# Patient Record
Sex: Male | Born: 1950 | Race: Black or African American | Hispanic: No | Marital: Single | Smoking: Former smoker
Health system: Southern US, Community
[De-identification: ages and names within clinical notes are randomized; demographics above are authoritative.]

## PROBLEM LIST (undated history)

## (undated) DIAGNOSIS — E785 Hyperlipidemia, unspecified: Secondary | ICD-10-CM

## (undated) DIAGNOSIS — I872 Venous insufficiency (chronic) (peripheral): Secondary | ICD-10-CM

## (undated) DIAGNOSIS — I5189 Other ill-defined heart diseases: Secondary | ICD-10-CM

## (undated) DIAGNOSIS — G629 Polyneuropathy, unspecified: Secondary | ICD-10-CM

## (undated) DIAGNOSIS — E0500 Thyrotoxicosis with diffuse goiter without thyrotoxic crisis or storm: Secondary | ICD-10-CM

## (undated) DIAGNOSIS — I2510 Atherosclerotic heart disease of native coronary artery without angina pectoris: Secondary | ICD-10-CM

## (undated) DIAGNOSIS — I10 Essential (primary) hypertension: Secondary | ICD-10-CM

## (undated) DIAGNOSIS — E039 Hypothyroidism, unspecified: Secondary | ICD-10-CM

---

## 2015-01-20 ENCOUNTER — Encounter (HOSPITAL_COMMUNITY): Payer: Self-pay | Admitting: Pulmonary Disease

## 2015-01-20 ENCOUNTER — Inpatient Hospital Stay (HOSPITAL_COMMUNITY): Payer: Non-veteran care

## 2015-01-20 ENCOUNTER — Inpatient Hospital Stay (HOSPITAL_COMMUNITY)
Admission: EM | Admit: 2015-01-20 | Discharge: 2015-01-29 | DRG: 637 | Disposition: A | Discharge status: Home Health | Payer: Non-veteran care | Attending: Family Medicine | Admitting: Family Medicine

## 2015-01-20 DIAGNOSIS — J9690 Respiratory failure, unspecified, unspecified whether with hypoxia or hypercapnia: Secondary | ICD-10-CM

## 2015-01-20 DIAGNOSIS — E870 Hyperosmolality and hypernatremia: Secondary | ICD-10-CM | POA: Diagnosis present

## 2015-01-20 DIAGNOSIS — E119 Type 2 diabetes mellitus without complications: Secondary | ICD-10-CM

## 2015-01-20 DIAGNOSIS — E039 Hypothyroidism, unspecified: Secondary | ICD-10-CM | POA: Diagnosis present

## 2015-01-20 DIAGNOSIS — I129 Hypertensive chronic kidney disease with stage 1 through stage 4 chronic kidney disease, or unspecified chronic kidney disease: Secondary | ICD-10-CM | POA: Diagnosis present

## 2015-01-20 DIAGNOSIS — E876 Hypokalemia: Secondary | ICD-10-CM | POA: Diagnosis not present

## 2015-01-20 DIAGNOSIS — N179 Acute kidney failure, unspecified: Secondary | ICD-10-CM | POA: Diagnosis present

## 2015-01-20 DIAGNOSIS — R32 Unspecified urinary incontinence: Secondary | ICD-10-CM | POA: Diagnosis present

## 2015-01-20 DIAGNOSIS — I639 Cerebral infarction, unspecified: Secondary | ICD-10-CM

## 2015-01-20 DIAGNOSIS — N189 Chronic kidney disease, unspecified: Secondary | ICD-10-CM | POA: Diagnosis present

## 2015-01-20 DIAGNOSIS — I2510 Atherosclerotic heart disease of native coronary artery without angina pectoris: Secondary | ICD-10-CM

## 2015-01-20 DIAGNOSIS — K859 Acute pancreatitis, unspecified: Secondary | ICD-10-CM | POA: Diagnosis present

## 2015-01-20 DIAGNOSIS — E875 Hyperkalemia: Secondary | ICD-10-CM | POA: Diagnosis present

## 2015-01-20 DIAGNOSIS — G9341 Metabolic encephalopathy: Secondary | ICD-10-CM | POA: Diagnosis present

## 2015-01-20 DIAGNOSIS — E1110 Type 2 diabetes mellitus with ketoacidosis without coma: Secondary | ICD-10-CM | POA: Diagnosis present

## 2015-01-20 DIAGNOSIS — E1310 Other specified diabetes mellitus with ketoacidosis without coma: Principal | ICD-10-CM | POA: Diagnosis present

## 2015-01-20 DIAGNOSIS — I214 Non-ST elevation (NSTEMI) myocardial infarction: Secondary | ICD-10-CM | POA: Diagnosis present

## 2015-01-20 DIAGNOSIS — I252 Old myocardial infarction: Secondary | ICD-10-CM | POA: Diagnosis not present

## 2015-01-20 DIAGNOSIS — K5900 Constipation, unspecified: Secondary | ICD-10-CM | POA: Diagnosis not present

## 2015-01-20 DIAGNOSIS — E1011 Type 1 diabetes mellitus with ketoacidosis with coma: Secondary | ICD-10-CM | POA: Insufficient documentation

## 2015-01-20 DIAGNOSIS — D696 Thrombocytopenia, unspecified: Secondary | ICD-10-CM | POA: Diagnosis present

## 2015-01-20 DIAGNOSIS — R531 Weakness: Secondary | ICD-10-CM

## 2015-01-20 DIAGNOSIS — R4182 Altered mental status, unspecified: Secondary | ICD-10-CM | POA: Diagnosis present

## 2015-01-20 DIAGNOSIS — E0810 Diabetes mellitus due to underlying condition with ketoacidosis without coma: Secondary | ICD-10-CM | POA: Diagnosis not present

## 2015-01-20 DIAGNOSIS — I8290 Acute embolism and thrombosis of unspecified vein: Secondary | ICD-10-CM | POA: Insufficient documentation

## 2015-01-20 HISTORY — DX: Essential (primary) hypertension: I10

## 2015-01-20 HISTORY — DX: Thyrotoxicosis with diffuse goiter without thyrotoxic crisis or storm: E05.00

## 2015-01-20 HISTORY — DX: Hypothyroidism, unspecified: E03.9

## 2015-01-20 LAB — COMPREHENSIVE METABOLIC PANEL
ALBUMIN: 3.2 g/dL — AB (ref 3.5–5.2)
ALT: 29 U/L (ref 0–53)
AST: 47 U/L — AB (ref 0–37)
Alkaline Phosphatase: 88 U/L (ref 39–117)
Anion gap: 28 — ABNORMAL HIGH (ref 5–15)
BUN: 92 mg/dL — AB (ref 6–23)
CO2: 12 mmol/L — ABNORMAL LOW (ref 19–32)
CREATININE: 6.12 mg/dL — AB (ref 0.50–1.35)
Calcium: 8.6 mg/dL (ref 8.4–10.5)
Chloride: 101 mmol/L (ref 96–112)
GFR calc Af Amer: 10 mL/min — ABNORMAL LOW (ref >90)
GFR calc non Af Amer: 9 mL/min — ABNORMAL LOW (ref >90)
GLUCOSE: 1399 mg/dL — AB (ref 70–99)
Potassium: 6.4 mmol/L (ref 3.5–5.1)
SODIUM: 141 mmol/L (ref 135–145)
Total Bilirubin: 2.4 mg/dL — ABNORMAL HIGH (ref 0.3–1.2)
Total Protein: 6.4 g/dL (ref 6.0–8.3)

## 2015-01-20 LAB — I-STAT VENOUS BLOOD GAS, ED
ACID-BASE DEFICIT: 18 mmol/L — AB (ref 0.0–2.0)
Bicarbonate: 10.5 mEq/L — ABNORMAL LOW (ref 20.0–24.0)
O2 SAT: 55 %
PCO2 VEN: 33.9 mmHg — AB (ref 45.0–50.0)
TCO2: 12 mmol/L (ref 0–100)
pH, Ven: 7.1 — CL (ref 7.250–7.300)
pO2, Ven: 39 mmHg (ref 30.0–45.0)

## 2015-01-20 LAB — URINALYSIS, ROUTINE W REFLEX MICROSCOPIC
Glucose, UA: >1000 mg/dL — AB
Ketones, ur: 15 mg/dL — AB
Ketones, ur: 15 mg/dL — AB
LEUKOCYTES UA: NEGATIVE
LEUKOCYTES UA: NEGATIVE
NITRITE: NEGATIVE
NITRITE: NEGATIVE
PH: 5 (ref 5.0–8.0)
PROTEIN: NEGATIVE mg/dL
Protein, ur: NEGATIVE mg/dL
SPECIFIC GRAVITY, URINE: 1.027 (ref 1.005–1.030)
SPECIFIC GRAVITY, URINE: 1.028 (ref 1.005–1.030)
UROBILINOGEN UA: 0.2 mg/dL (ref 0.0–1.0)
Urobilinogen, UA: 0.2 mg/dL (ref 0.0–1.0)
pH: 5 (ref 5.0–8.0)

## 2015-01-20 LAB — BASIC METABOLIC PANEL
Anion gap: 25 — ABNORMAL HIGH (ref 5–15)
BUN: 94 mg/dL — ABNORMAL HIGH (ref 6–23)
CO2: 13 mmol/L — ABNORMAL LOW (ref 19–32)
Calcium: 8.9 mg/dL (ref 8.4–10.5)
Chloride: 109 mmol/L (ref 96–112)
Creatinine, Ser: 5.53 mg/dL — ABNORMAL HIGH (ref 0.50–1.35)
GFR calc Af Amer: 11 mL/min — ABNORMAL LOW (ref >90)
GFR calc non Af Amer: 10 mL/min — ABNORMAL LOW (ref >90)
GLUCOSE: 1134 mg/dL — AB (ref 70–99)
Potassium: 5.5 mmol/L — ABNORMAL HIGH (ref 3.5–5.1)
SODIUM: 147 mmol/L — AB (ref 135–145)

## 2015-01-20 LAB — CBC WITH DIFFERENTIAL/PLATELET
BASOS ABS: 0 10*3/uL (ref 0.0–0.1)
Basophils Relative: 0 % (ref 0–1)
EOS PCT: 0 % (ref 0–5)
Eosinophils Absolute: 0 10*3/uL (ref 0.0–0.7)
HCT: 52.7 % — ABNORMAL HIGH (ref 39.0–52.0)
Hemoglobin: 16.1 g/dL (ref 13.0–17.0)
LYMPHS ABS: 1.4 10*3/uL (ref 0.7–4.0)
LYMPHS PCT: 12 % (ref 12–46)
MCH: 33.3 pg (ref 26.0–34.0)
MCHC: 30.6 g/dL (ref 30.0–36.0)
MCV: 109.1 fL — AB (ref 78.0–100.0)
MONO ABS: 1.1 10*3/uL — AB (ref 0.1–1.0)
Monocytes Relative: 9 % (ref 3–12)
NEUTROS ABS: 9.4 10*3/uL — AB (ref 1.7–7.7)
Neutrophils Relative %: 79 % — ABNORMAL HIGH (ref 43–77)
PLATELETS: 248 10*3/uL (ref 150–400)
RBC: 4.83 MIL/uL (ref 4.22–5.81)
RDW: 15.1 % (ref 11.5–15.5)
WBC: 11.9 10*3/uL — ABNORMAL HIGH (ref 4.0–10.5)

## 2015-01-20 LAB — TROPONIN I
TROPONIN I: 12.15 ng/mL — AB (ref <0.031)
Troponin I: 3.94 ng/mL (ref <0.031)

## 2015-01-20 LAB — URINE MICROSCOPIC-ADD ON

## 2015-01-20 LAB — PHOSPHORUS: Phosphorus: 4.9 mg/dL — ABNORMAL HIGH (ref 2.3–4.6)

## 2015-01-20 LAB — MAGNESIUM: Magnesium: 4.5 mg/dL — ABNORMAL HIGH (ref 1.5–2.5)

## 2015-01-20 LAB — RAPID URINE DRUG SCREEN, HOSP PERFORMED
Amphetamines: NOT DETECTED
Barbiturates: NOT DETECTED
Benzodiazepines: NOT DETECTED
COCAINE: NOT DETECTED
Opiates: NOT DETECTED
TETRAHYDROCANNABINOL: NOT DETECTED

## 2015-01-20 LAB — CBG MONITORING, ED
Glucose-Capillary: >600 mg/dL (ref 70–99)
Glucose-Capillary: >600 mg/dL (ref 70–99)

## 2015-01-20 LAB — I-STAT CHEM 8, ED
BUN: 116 mg/dL — ABNORMAL HIGH (ref 6–23)
CALCIUM ION: 1.08 mmol/L — AB (ref 1.13–1.30)
Chloride: 106 mmol/L (ref 96–112)
Creatinine, Ser: 5 mg/dL — ABNORMAL HIGH (ref 0.50–1.35)
HEMATOCRIT: 54 % — AB (ref 39.0–52.0)
Hemoglobin: 18.4 g/dL — ABNORMAL HIGH (ref 13.0–17.0)
Potassium: 7.9 mmol/L (ref 3.5–5.1)
Sodium: 136 mmol/L (ref 135–145)
TCO2: 8 mmol/L (ref 0–100)

## 2015-01-20 LAB — GLUCOSE, CAPILLARY: Glucose-Capillary: >600 mg/dL (ref 70–99)

## 2015-01-20 LAB — I-STAT TROPONIN, ED: Troponin i, poc: 5.24 ng/mL (ref 0.00–0.08)

## 2015-01-20 LAB — I-STAT CG4 LACTIC ACID, ED: LACTIC ACID, VENOUS: 5 mmol/L — AB (ref 0.5–2.0)

## 2015-01-20 LAB — AMYLASE: Amylase: 1500 U/L — ABNORMAL HIGH (ref 0–105)

## 2015-01-20 LAB — LACTIC ACID, PLASMA: Lactic Acid, Venous: 2.8 mmol/L (ref 0.5–2.0)

## 2015-01-20 LAB — LIPASE, BLOOD: Lipase: 2490 U/L — ABNORMAL HIGH (ref 11–59)

## 2015-01-20 LAB — MRSA PCR SCREENING: MRSA BY PCR: NEGATIVE

## 2015-01-20 LAB — TSH: TSH: 2.144 u[IU]/mL (ref 0.350–4.500)

## 2015-01-20 LAB — SODIUM, URINE, RANDOM

## 2015-01-20 LAB — CREATININE, URINE, RANDOM: CREATININE, URINE: 76.66 mg/dL

## 2015-01-20 LAB — CK: Total CK: 349 U/L — ABNORMAL HIGH (ref 7–232)

## 2015-01-20 MED ORDER — DEXTROSE-NACL 5-0.45 % IV SOLN
INTRAVENOUS | Status: DC
  Administered 2015-01-21 (×2): via INTRAVENOUS

## 2015-01-20 MED ORDER — PANTOPRAZOLE SODIUM 40 MG IV SOLR
40.0000 mg | INTRAVENOUS | Status: DC
  Administered 2015-01-20 – 2015-01-22 (×3): 40 mg via INTRAVENOUS
  Filled 2015-01-20 (×4): qty 40

## 2015-01-20 MED ORDER — SODIUM CHLORIDE 0.9 % IV BOLUS (SEPSIS)
1000.0000 mL | Freq: Once | INTRAVENOUS | Status: AC
  Administered 2015-01-20: 1000 mL via INTRAVENOUS

## 2015-01-20 MED ORDER — SODIUM CHLORIDE 0.9 % IV SOLN
1000.0000 mL | INTRAVENOUS | Status: DC
  Administered 2015-01-20: 1000 mL via INTRAVENOUS

## 2015-01-20 MED ORDER — DEXTROSE-NACL 5-0.45 % IV SOLN: INTRAVENOUS | Status: DC

## 2015-01-20 MED ORDER — SODIUM CHLORIDE 0.9 % IV SOLN
INTRAVENOUS | Status: DC
  Administered 2015-01-20: 5.4 [IU]/h via INTRAVENOUS
  Filled 2015-01-20: qty 2.5

## 2015-01-20 MED ORDER — ONDANSETRON HCL 4 MG/2ML IJ SOLN
4.0000 mg | Freq: Four times a day (QID) | INTRAMUSCULAR | Status: DC | PRN
Start: 2015-01-20 — End: 2015-01-29

## 2015-01-20 MED ORDER — ASPIRIN 300 MG RE SUPP
300.0000 mg | Freq: Once | RECTAL | Status: AC
  Administered 2015-01-20: 300 mg via RECTAL
  Filled 2015-01-20: qty 1

## 2015-01-20 MED ORDER — SODIUM CHLORIDE 0.9 % IV SOLN
1000.0000 mL | Freq: Once | INTRAVENOUS | Status: AC
  Administered 2015-01-20: 1000 mL via INTRAVENOUS

## 2015-01-20 MED ORDER — HEPARIN SODIUM (PORCINE) 5000 UNIT/ML IJ SOLN
5000.0000 [IU] | Freq: Three times a day (TID) | INTRAMUSCULAR | Status: DC
  Administered 2015-01-20 – 2015-01-21 (×3): 5000 [IU] via SUBCUTANEOUS
  Filled 2015-01-20 (×6): qty 1

## 2015-01-20 MED ORDER — CETYLPYRIDINIUM CHLORIDE 0.05 % MT LIQD
7.0000 mL | Freq: Two times a day (BID) | OROMUCOSAL | Status: DC
  Administered 2015-01-20 – 2015-01-21 (×3): 7 mL via OROMUCOSAL

## 2015-01-20 MED ORDER — SODIUM CHLORIDE 0.9 % IV SOLN
INTRAVENOUS | Status: DC
  Administered 2015-01-20: 20:00:00 via INTRAVENOUS

## 2015-01-20 MED ORDER — INSULIN REGULAR HUMAN 100 UNIT/ML IJ SOLN
INTRAMUSCULAR | Status: DC
  Administered 2015-01-21: 7.9 [IU]/h via INTRAVENOUS
  Administered 2015-01-21: 10.1 [IU]/h via INTRAVENOUS
  Filled 2015-01-20 (×3): qty 2.5

## 2015-01-20 MED ORDER — SODIUM CHLORIDE 0.9 % IV SOLN: INTRAVENOUS | Status: AC

## 2015-01-20 NOTE — Progress Notes (Signed)
eLink Physician-Brief Progress Note Patient Name: Dave LevinsWilliam Cervantes DOB: 03/09/1951 MRN: [id number]   Date of Service  01/20/2015  HPI/Events of Note  Lactic acid still high but better  eICU Interventions  Give more volume     Intervention Category Major Interventions: Hypotension - evaluation and management;Shock - evaluation and management  Shan Levansatrick Jerol Rufener 01/20/2015, 6:18 PM

## 2015-01-20 NOTE — ED Notes (Signed)
  CBG HI 

## 2015-01-20 NOTE — ED Notes (Signed)
To ED via [facility] from home with c/o of altered mental status-- family found pt slumped on floor in front of his bedroom door, pt had multiple juice bottles full of urine in room, as well as garbage around the room- pt was in puddle of urine, unknown downtime. EMS CBG was high, family denies any hx of diabetes. [facility].

## 2015-01-20 NOTE — ED Notes (Signed)
Sister states that she could not get in the pt's bedroom door last night, brother last talked to pt two days ago and pt was normal.

## 2015-01-20 NOTE — ED Notes (Signed)
CBG >600 again, glucostabilizer read to change insulin to 21.6 units/hr

## 2015-01-20 NOTE — Progress Notes (Signed)
Lactic acid level 2.8 called to Dr. Jonni SangerP. Wright. Trop level of 3.94 Dr. Jonni SangerP Wright notified.

## 2015-01-20 NOTE — ED Provider Notes (Signed)
CSN: [id number]     Arrival date & time 01/20/15  1207 History   First MD Initiated Contact with Patient 01/20/15 1221     Chief Complaint  Patient presents with  . Altered Mental Status     (Consider location/radiation/quality/duration/timing/severity/associated sxs/prior Treatment) HPI Comments: LEVEL 5 CAVEAT FOR ALTERED MENTAL STATUS.  Pt comes in with cc of confusion. Per EMS, family noted pt to be confused, with lots of juice bottles filled with urine. Pt has no hx of DM. Unknown last normal. Pt is confused and altered. GCS - e/v/m  = 2/2/4 = 8 at arrival.   Patient is a 64 y.o. male presenting with altered mental status. The history is provided by the patient.  Altered Mental Status   Past Medical History  Diagnosis Date  . Hypertension   . Hypothyroidism    No past surgical history on file. No family history on file. History  Substance Use Topics  . Smoking status: Not on file  . Smokeless tobacco: Not on file  . Alcohol Use: Not on file    Review of Systems  Unable to perform ROS: Mental status change      Allergies  Review of patient's allergies indicates no known allergies.  Home Medications   Prior to Admission medications   Not on File   BP 107/69 mmHg  Pulse 86  Temp(Src) 94.8 F (34.9 C) (Core (Comment))  Resp 23  SpO2 99% Physical Exam  Constitutional: He appears well-developed.  HENT:  Head: Atraumatic.  Dry mucosa  Neck: Neck supple.  Cardiovascular: Normal rate.   Pulmonary/Chest:  tachypnea  Abdominal: Soft. There is no tenderness.  Lymphadenopathy:    He has no cervical adenopathy.  Neurological:  gcs - 8  Skin: Skin is warm.  Nursing note and vitals reviewed.   ED Course  Procedures (including critical care time) Labs Review Labs Reviewed  CBC WITH DIFFERENTIAL/PLATELET - Abnormal; Notable for the following:    WBC 11.9 (*)    HCT 52.7 (*)    MCV 109.1 (*)    Neutrophils Relative % 79 (*)    Neutro Abs 9.4 (*)    Monocytes Absolute 1.1 (*)    All other components within normal limits  URINALYSIS, ROUTINE W REFLEX MICROSCOPIC - Abnormal; Notable for the following:    Glucose, UA >1000 (*)    Hgb urine dipstick SMALL (*)    Bilirubin Urine SMALL (*)    Ketones, ur 15 (*)    All other components within normal limits  CK - Abnormal; Notable for the following:    Total CK 349 (*)    All other components within normal limits  URINE MICROSCOPIC-ADD ON - Abnormal; Notable for the following:    Bacteria, UA FEW (*)    Casts HYALINE CASTS (*)    All other components within normal limits  COMPREHENSIVE METABOLIC PANEL - Abnormal; Notable for the following:    Potassium 6.4 (*)    CO2 12 (*)    Glucose, Bld 1399 (*)    BUN 92 (*)    Creatinine, Ser 6.12 (*)    Albumin 3.2 (*)    AST 47 (*)    Total Bilirubin 2.4 (*)    GFR calc non Af Amer 9 (*)    GFR calc Af Amer 10 (*)    Anion gap 28 (*)    All other components within normal limits  CBG MONITORING, ED - Abnormal; Notable for the following:    Glucose-Capillary >  600 (*)    All other components within normal limits  CBG MONITORING, ED - Abnormal; Notable for the following:    Glucose-Capillary >600 (*)    All other components within normal limits  I-STAT VENOUS BLOOD GAS, ED - Abnormal; Notable for the following:    pH, Ven 7.100 (*)    pCO2, Ven 33.9 (*)    Bicarbonate 10.5 (*)    Acid-base deficit 18.0 (*)    All other components within normal limits  I-STAT CHEM 8, ED - Abnormal; Notable for the following:    Potassium 7.9 (*)    BUN 116 (*)    Creatinine, Ser 5.00 (*)    Glucose, Bld >700 (*)    Calcium, Ion 1.08 (*)    Hemoglobin 18.4 (*)    HCT 54.0 (*)    All other components within normal limits  I-STAT CG4 LACTIC ACID, ED - Abnormal; Notable for the following:    Lactic Acid, Venous 5.00 (*)    All other components within normal limits  I-STAT TROPOININ, ED - Abnormal; Notable for the following:    Troponin i, poc 5.24 (*)     All other components within normal limits  CBG MONITORING, ED - Abnormal; Notable for the following:    Glucose-Capillary >600 (*)    All other components within normal limits  CBG MONITORING, ED - Abnormal; Notable for the following:    Glucose-Capillary >600 (*)    All other components within normal limits  URINE CULTURE  CULTURE, BLOOD (ROUTINE X 2)  CULTURE, BLOOD (ROUTINE X 2)  CULTURE, BLOOD (ROUTINE X 2)  CULTURE, BLOOD (ROUTINE X 2)  URINE CULTURE  TROPONIN I  BASIC METABOLIC PANEL  BASIC METABOLIC PANEL  BASIC METABOLIC PANEL  BASIC METABOLIC PANEL  CBC  MAGNESIUM  PHOSPHORUS  CBC WITH DIFFERENTIAL/PLATELET  TROPONIN I  TROPONIN I  URINALYSIS, ROUTINE W REFLEX MICROSCOPIC  URINE RAPID DRUG SCREEN (HOSP PERFORMED)  HEMOGLOBIN A1C  LIPASE, BLOOD  AMYLASE  LACTIC ACID, PLASMA  TSH  SODIUM, URINE, RANDOM  CREATININE, URINE, RANDOM    Imaging Review Dg Chest Port 1 View  01/20/2015   CLINICAL DATA:  Respiratory failure and altered mental status.  EXAM: PORTABLE CHEST - 1 VIEW  COMPARISON:  None.  FINDINGS: The mediastinal contour is normal. The heart size is upper limits of normal. The aorta is tortuous. The lung volumes are low. There is mild elevation of right hemidiaphragm. There is no focal infiltrate, pulmonary edema, or pleural effusion. The visualized skeletal structures are unremarkable.  IMPRESSION: No active cardiopulmonary disease.   Electronically Signed   By: Sherian Rein M.D.   On: 01/20/2015 16:30     EKG Interpretation   Date/Time:  Monday January 20 2015 12:08:42 EST Ventricular Rate:  83 PR Interval:  171 QRS Duration: 104 QT Interval:  421 QTC Calculation: 495 R Axis:   55 Text Interpretation:  Sinus rhythm Consider left atrial enlargement  Inferior infarct, age indeterminate Lateral leads are also involved Q  WAVES INFERIOR LEADS without SY changes No old tracing to compare  Confirmed by Rhunette Croft, MD, Janey Genta [phone]) on 01/20/2015 2:16:46  PM      MDM   Final diagnoses:  Type 1 diabetes mellitus with ketoacidosis and coma  NSTEMI (non-ST elevated myocardial infarction)    CRITICAL CARE Performed by: Derwood Kaplan   Total critical care time: 50 min  Critical care time was exclusive of separately billable procedures and treating other patients.  Critical  care was necessary to treat or prevent imminent or life-threatening deterioration.  Critical care was time spent personally by me on the following activities: development of treatment plan with patient and/or surrogate as well as nursing, discussions with consultants, evaluation of patient's response to treatment, examination of patient, obtaining history from patient or surrogate, ordering and performing treatments and interventions, ordering and review of laboratory studies, ordering and review of radiographic studies, pulse oximetry and re-evaluation of patient's condition.   Pt comes in with cc of AMS. He is note to be hyperglycemic. Pt unable to get any history, no records in our system, family not at bedside.  Pt is dry, and is in DKA. Elevated lactate seen, with elevated troponin. Rectal Aspirin to be given.  Pt is comatose from DKA - so we will call ICU team. Pt is protecting airway. DKA protocol initiated - ivf, insulin. Labs reviewed.   Derwood Kaplan, MD 01/20/15 [phone]

## 2015-01-20 NOTE — Procedures (Signed)
Arterial Catheter Insertion Procedure Note Corwin LevinsWilliam Elks [id number] [birth year]  Procedure: Insertion of Arterial Catheter  Indications: Blood pressure monitoring and Frequent blood sampling  Procedure Details Consent: Unable to obtain consent because of altered level of consciousness. Time Out: Verified patient identification, verified procedure, site/side was marked, verified correct patient position, special equipment/implants available, medications/allergies/relevent history reviewed, required imaging and test results available.  Performed  Maximum sterile technique was used including antiseptics, cap, gloves, gown, hand hygiene, mask and sheet. Skin prep: Chlorhexidine; local anesthetic administered 20 gauge catheter was inserted into right radial artery using the Seldinger technique.  Evaluation Blood flow good; BP tracing good. Complications: No apparent complications.   Inez PilgrimCOCKMAN, Kendrew Paci 01/20/2015

## 2015-01-20 NOTE — ED Notes (Addendum)
Dr. Rhunette CroftNanavati was given the patients critical I-Stat Troponin results of 5.24.

## 2015-01-20 NOTE — H&P (Signed)
PULMONARY / CRITICAL CARE MEDICINE   Name: Dave Cervantes MRN: [id number] DOB: Oct 12, 1951    ADMISSION DATE:  01/20/2015 CONSULTATION DATE:  01/20/2015  REFERRING MD :  EDP  CHIEF COMPLAINT:  AMS  INITIAL PRESENTATION: 64 year old male presented to [facility] ED 2/15 with AMS x 2 days. In ED was lethargic and hyperglycemic (Glu 1400) with positive urine ketones. PCCM called for admission.  STUDIES:    SIGNIFICANT EVENTS:   HISTORY OF PRESENT ILLNESS:  64 year old male with PMH significant for HTN and hypothyroidism followed at [place]. He presented to [facility] ED 2/15 following AMS x 2 days. Day of presentation his sister found him slumped in front of bedroom door. He was incontinent of urine. She called EMS and he was brought to [facility] ED where he was found to be severely hyperglycemic with glucose 1400. He was started on insulin gtt and provided volume with 2L NS. He remained somnolent and PCCM was called for admission.   PAST MEDICAL HISTORY :   has no past medical history on file.  has no past surgical history on file. Prior to Admission medications   Not on File   Allergies not on file  FAMILY HISTORY:  has no family status information on file.  SOCIAL HISTORY:    REVIEW OF SYSTEMS:  Limited due to somnolence Bolds are positive  Constitutional: weight loss, gain, night sweats, Fevers, chills, fatigue .  HEENT: headaches, Sore throat, sneezing, nasal congestion, post nasal drip, Difficulty swallowing, Tooth/dental problems, visual complaints visual changes, ear ache CV:  chest pain, radiates: ,Orthopnea, PND, swelling in lower extremities, dizziness, palpitations, syncope.  GI  heartburn, indigestion, abdominal pain, nausea, vomiting, diarrhea, change in bowel habits, loss of appetite, bloody stools.  Resp: cough, productive: , hemoptysis, dyspnea, chest pain, pleuritic.  Skin: rash or itching or icterus GU: dysuria, change in color of urine, urgency or frequency. flank pain, hematuria  MS:  joint pain or swelling. decreased range of motion  Psych: change in mood or affect. depression or anxiety.  Neuro: difficulty with speech, weakness, numbness, ataxia    SUBJECTIVE:   VITAL SIGNS: Temp:  [94.5 F (34.7 C)-95.1 F (35.1 C)] 94.8 F (34.9 C) (02/15 1500) Pulse Rate:  [80-86] 86 (02/15 1500) Resp:  [20-27] 21 (02/15 1500) BP: (101-118)/(63-77) 109/77 mmHg (02/15 1500) SpO2:  [99 %-100 %] 99 % (02/15 1500) HEMODYNAMICS:   VENTILATOR SETTINGS:   INTAKE / OUTPUT:  Intake/Output Summary (Last 24 hours) at 01/20/15 1523 Last data filed at 01/20/15 1300  Gross per 24 hour  Intake      0 ml  Output    300 ml  Net   -300 ml    PHYSICAL EXAMINATION: General:  Male of normal body habitus in NAD Neuro:  Lethargic, follows commands HEENT:  [place]/AT, no JVD Cardiovascular:  RRR, no MRG Lungs:  Clear bilateral breath sounds Abdomen:  Soft, non-tender, non-distended Musculoskeletal:  No acute deformities or ROM limitation Skin:  Grossly intact  LABS:  CBC  Recent Labs Lab 01/20/15 1223 01/20/15 1249  WBC 11.9*  --   HGB 16.1 18.4*  HCT 52.7* 54.0*  PLT 248  --    Coag's No results for input(s): APTT, INR in the last 168 hours. BMET  Recent Labs Lab 01/20/15 1249 01/20/15 1404  NA 136 141  K 7.9* 6.4*  CL 106 101  CO2  --  12*  BUN 116* 92*  CREATININE 5.00* 6.12*  GLUCOSE >700* 1399*  Electrolytes  Recent Labs Lab 01/20/15 1404  CALCIUM 8.6   Sepsis Markers  Recent Labs Lab 01/20/15 1249  LATICACIDVEN 5.00*   ABG No results for input(s): PHART, PCO2ART, PO2ART in the last 168 hours. Liver Enzymes  Recent Labs Lab 01/20/15 1404  AST 47*  ALT 29  ALKPHOS 88  BILITOT 2.4*  ALBUMIN 3.2*   Cardiac Enzymes No results for input(s): TROPONINI, PROBNP in the last 168 hours. Glucose  Recent Labs Lab 01/20/15 1214 01/20/15 1407 01/20/15 1514  GLUCAP >600* >600* >600*    Imaging No results found.   ASSESSMENT /  PLAN:  PULMONARY A: No acute issues  P:   Monitor  CARDIOVASCULAR A:  Elevated troponin H/o HTN  P:  Follow troponin Hold preadmission Amlodipine, HCTZ, lisinopril   RENAL A:   High AG metabolic acidosis - lactic, ketoacidosis Acute renal failure Hyperkalemia  P:   Check FeNa K repletion per DKA protocol Follow Bmet q 4 hours Repeat lactic acid  GASTROINTESTINAL A:  No acute issues  P:   NPO SUP: IV Protonix Amylase, lipase  HEMATOLOGIC A:   No acute issues  P:  Follow CBC  INFECTIOUS A:  Leukocytosis  P:   BCx2 2/15 >>> Urine 2/15 >>> Monitor  ENDOCRINE A:  Diabetic ketoacidosis  P:   Initiate DKA protocol Insulin gtt IVF Holding synthroid Hourly CBG monitoring Check TSH  NEUROLOGIC A:   Acute metabolic encephalatrophy   P:   RASS goal: 0 Monitor UDS   FAMILY  - Updates:   - Inter-disciplinary family meet or Palliative Care meeting due by:  2/22   Joneen RoachPaul Hoffman, AGACNP-BC [place] [facility]/Critical [facility] Pager [phone] or [phone]   Reviewed above, examined.  64 yo male was brought to ER after being found by family lethargic on floor at home.  He reportedly felt ill and might have been getting a cold.  [facility].  There is no hx of DM.  In ER he was found to have renal failure, hyperkalemia, acidosis, and hyperglycemia.  He was started on insulin gtt and IV fluids.  He did not have ECG hyperkalemia.  He is lethargic, but wakes up with stimulation.  His pupils are reactive, he moves all extremities, and is able to speak in short sentences.  He has dry oral mucosa with poor dentition.  Breath sounds are decreased, but no wheeze.  Heart rate is regular, abdomen soft, no skin lesions.  Will admit to ICU, continue insulin gtt/IV fluids, check FeNA, f/u electrolytes, f/u TSH, f/u Lactic acid.  Defer Abx for now.  Defer renal consult for now.  [place] to get [place]  [facility] [place] and [place].  CC time by me independent of APP time is 40 minutes.  Updated pt's family at bedside.  Coralyn HellingVineet Finis Hendricksen, MD [facility] 01/20/2015, 3:51 PM Pager:  [phone] After 3pm call: [phone]

## 2015-01-20 NOTE — ED Notes (Signed)
Dr. Rhunette CroftNanavati was given the patients critical I-Stat Chem 8, I-Stat CG4 Lactic Acid of 5.00 and I-Stat Venous Blood Gas results.

## 2015-01-20 NOTE — Progress Notes (Signed)
Pts. CBG still reading greater than 600. Glucose drip currently maxed out at 50 units per hour. Glucose drip will continue at 50 units per hour per Dr. Lynelle DoctorWright's order until exact glucose obtained on next BMET. Pt. Will continue to be closely monitored.

## 2015-01-20 NOTE — ED Notes (Signed)
Notified that cmp and troponin hemolyzed. Tubes redrawn and orders placed.

## 2015-01-21 ENCOUNTER — Inpatient Hospital Stay (HOSPITAL_COMMUNITY): Payer: Non-veteran care

## 2015-01-21 DIAGNOSIS — J9690 Respiratory failure, unspecified, unspecified whether with hypoxia or hypercapnia: Secondary | ICD-10-CM

## 2015-01-21 LAB — BASIC METABOLIC PANEL
ANION GAP: 11 (ref 5–15)
Anion gap: 26 — ABNORMAL HIGH (ref 5–15)
Anion gap: 12 (ref 5–15)
Anion gap: 12 (ref 5–15)
BUN: 97 mg/dL — ABNORMAL HIGH (ref 6–23)
BUN: 93 mg/dL — ABNORMAL HIGH (ref 6–23)
BUN: 91 mg/dL — ABNORMAL HIGH (ref 6–23)
BUN: 88 mg/dL — ABNORMAL HIGH (ref 6–23)
BUN: 83 mg/dL — ABNORMAL HIGH (ref 6–23)
BUN: 78 mg/dL — ABNORMAL HIGH (ref 6–23)
CHLORIDE: 118 mmol/L — AB (ref 96–112)
CHLORIDE: 125 mmol/L — AB (ref 96–112)
CHLORIDE: 129 mmol/L — AB (ref 96–112)
CO2: 10 mmol/L — CL (ref 19–32)
CO2: 20 mmol/L (ref 19–32)
CO2: 20 mmol/L (ref 19–32)
CO2: 20 mmol/L (ref 19–32)
CO2: 22 mmol/L (ref 19–32)
CO2: 17 mmol/L — ABNORMAL LOW (ref 19–32)
CREATININE: 5.49 mg/dL — AB (ref 0.50–1.35)
CREATININE: 3.81 mg/dL — AB (ref 0.50–1.35)
Calcium: 8.9 mg/dL (ref 8.4–10.5)
Calcium: 8.6 mg/dL (ref 8.4–10.5)
Calcium: 8.9 mg/dL (ref 8.4–10.5)
Calcium: 8.9 mg/dL (ref 8.4–10.5)
Calcium: 8.7 mg/dL (ref 8.4–10.5)
Calcium: 8.1 mg/dL — ABNORMAL LOW (ref 8.4–10.5)
Chloride: 129 mmol/L — ABNORMAL HIGH (ref 96–112)
Creatinine, Ser: 5.14 mg/dL — ABNORMAL HIGH (ref 0.50–1.35)
Creatinine, Ser: 4.32 mg/dL — ABNORMAL HIGH (ref 0.50–1.35)
Creatinine, Ser: 4.12 mg/dL — ABNORMAL HIGH (ref 0.50–1.35)
Creatinine, Ser: 3.57 mg/dL — ABNORMAL HIGH (ref 0.50–1.35)
GFR calc Af Amer: 12 mL/min — ABNORMAL LOW (ref >90)
GFR calc Af Amer: 13 mL/min — ABNORMAL LOW (ref >90)
GFR calc Af Amer: 15 mL/min — ABNORMAL LOW (ref >90)
GFR calc Af Amer: 16 mL/min — ABNORMAL LOW (ref >90)
GFR calc non Af Amer: 11 mL/min — ABNORMAL LOW (ref >90)
GFR calc non Af Amer: 13 mL/min — ABNORMAL LOW (ref >90)
GFR calc non Af Amer: 14 mL/min — ABNORMAL LOW (ref >90)
GFR calc non Af Amer: 15 mL/min — ABNORMAL LOW (ref >90)
GFR calc non Af Amer: 17 mL/min — ABNORMAL LOW (ref >90)
GFR, EST AFRICAN AMERICAN: 18 mL/min — AB (ref >90)
GFR, EST AFRICAN AMERICAN: 19 mL/min — AB (ref >90)
GFR, EST NON AFRICAN AMERICAN: 10 mL/min — AB (ref >90)
GLUCOSE: 216 mg/dL — AB (ref 70–99)
GLUCOSE: 442 mg/dL — AB (ref 70–99)
Glucose, Bld: 825 mg/dL (ref 70–99)
Glucose, Bld: 519 mg/dL — ABNORMAL HIGH (ref 70–99)
Glucose, Bld: 144 mg/dL — ABNORMAL HIGH (ref 70–99)
Glucose, Bld: 151 mg/dL — ABNORMAL HIGH (ref 70–99)
POTASSIUM: 3.2 mmol/L — AB (ref 3.5–5.1)
POTASSIUM: 3.7 mmol/L (ref 3.5–5.1)
Potassium: 3.7 mmol/L (ref 3.5–5.1)
Potassium: 3.1 mmol/L — ABNORMAL LOW (ref 3.5–5.1)
Potassium: 3.4 mmol/L — ABNORMAL LOW (ref 3.5–5.1)
Potassium: 3.5 mmol/L (ref 3.5–5.1)
SODIUM: 154 mmol/L — AB (ref 135–145)
SODIUM: 157 mmol/L — AB (ref 135–145)
SODIUM: 161 mmol/L — AB (ref 135–145)
Sodium: 160 mmol/L — ABNORMAL HIGH (ref 135–145)
Sodium: 163 mmol/L (ref 135–145)
Sodium: 158 mmol/L — ABNORMAL HIGH (ref 135–145)

## 2015-01-21 LAB — POCT I-STAT, CHEM 8
BUN: 116 mg/dL — AB (ref 6–23)
CHLORIDE: 106 mmol/L (ref 96–112)
CREATININE: 5 mg/dL — AB (ref 0.50–1.35)
Calcium, Ion: 1.08 mmol/L — ABNORMAL LOW (ref 1.13–1.30)
Glucose, Bld: >700 mg/dL (ref 70–99)
HCT: 54 % — ABNORMAL HIGH (ref 39.0–52.0)
Hemoglobin: 18.4 g/dL — ABNORMAL HIGH (ref 13.0–17.0)
POTASSIUM: 7.9 mmol/L — AB (ref 3.5–5.1)
Sodium: 136 mmol/L (ref 135–145)
TCO2: 8 mmol/L (ref 0–100)

## 2015-01-21 LAB — TROPONIN I
TROPONIN I: 28.37 ng/mL — AB (ref <0.031)
TROPONIN I: 25.2 ng/mL — AB (ref <0.031)
Troponin I: 32.19 ng/mL (ref <0.031)
Troponin I: 20.78 ng/mL (ref <0.031)

## 2015-01-21 LAB — GLUCOSE, CAPILLARY
GLUCOSE-CAPILLARY: 131 mg/dL — AB (ref 70–99)
GLUCOSE-CAPILLARY: 143 mg/dL — AB (ref 70–99)
GLUCOSE-CAPILLARY: 229 mg/dL — AB (ref 70–99)
GLUCOSE-CAPILLARY: 250 mg/dL — AB (ref 70–99)
GLUCOSE-CAPILLARY: 379 mg/dL — AB (ref 70–99)
GLUCOSE-CAPILLARY: 391 mg/dL — AB (ref 70–99)
GLUCOSE-CAPILLARY: 422 mg/dL — AB (ref 70–99)
GLUCOSE-CAPILLARY: 480 mg/dL — AB (ref 70–99)
Glucose-Capillary: >600 mg/dL (ref 70–99)
Glucose-Capillary: 317 mg/dL — ABNORMAL HIGH (ref 70–99)
Glucose-Capillary: 199 mg/dL — ABNORMAL HIGH (ref 70–99)
Glucose-Capillary: 162 mg/dL — ABNORMAL HIGH (ref 70–99)
Glucose-Capillary: 135 mg/dL — ABNORMAL HIGH (ref 70–99)
Glucose-Capillary: 130 mg/dL — ABNORMAL HIGH (ref 70–99)
Glucose-Capillary: 167 mg/dL — ABNORMAL HIGH (ref 70–99)
Glucose-Capillary: 142 mg/dL — ABNORMAL HIGH (ref 70–99)
Glucose-Capillary: 152 mg/dL — ABNORMAL HIGH (ref 70–99)
Glucose-Capillary: 139 mg/dL — ABNORMAL HIGH (ref 70–99)
Glucose-Capillary: 155 mg/dL — ABNORMAL HIGH (ref 70–99)
Glucose-Capillary: 144 mg/dL — ABNORMAL HIGH (ref 70–99)

## 2015-01-21 LAB — CBC
HCT: 44.1 % (ref 39.0–52.0)
Hemoglobin: 15.3 g/dL (ref 13.0–17.0)
MCH: 33.2 pg (ref 26.0–34.0)
MCHC: 34.7 g/dL (ref 30.0–36.0)
MCV: 95.7 fL (ref 78.0–100.0)
Platelets: 202 10*3/uL (ref 150–400)
RBC: 4.61 MIL/uL (ref 4.22–5.81)
RDW: 13.9 % (ref 11.5–15.5)
WBC: 12.1 10*3/uL — AB (ref 4.0–10.5)

## 2015-01-21 LAB — URINE CULTURE
Colony Count: NO GROWTH
Culture: NO GROWTH
Special Requests: NORMAL

## 2015-01-21 LAB — HEMOGLOBIN A1C
Hgb A1c MFr Bld: 13.7 % — ABNORMAL HIGH (ref 4.8–5.6)
Mean Plasma Glucose: 346 mg/dL

## 2015-01-21 LAB — LIPID PANEL
CHOL/HDL RATIO: 10 ratio
Cholesterol: 251 mg/dL — ABNORMAL HIGH (ref 0–200)
HDL: 25 mg/dL — ABNORMAL LOW (ref >39)
LDL Cholesterol: UNDETERMINED mg/dL (ref 0–99)
Triglycerides: 418 mg/dL — ABNORMAL HIGH (ref <150)
VLDL: UNDETERMINED mg/dL (ref 0–40)

## 2015-01-21 LAB — MAGNESIUM: MAGNESIUM: 4 mg/dL — AB (ref 1.5–2.5)

## 2015-01-21 LAB — HEPARIN LEVEL (UNFRACTIONATED): Heparin Unfractionated: 1.34 IU/mL — ABNORMAL HIGH (ref 0.30–0.70)

## 2015-01-21 LAB — AMMONIA: AMMONIA: 25 umol/L (ref 11–32)

## 2015-01-21 LAB — PHOSPHORUS: Phosphorus: <1 mg/dL — CL (ref 2.3–4.6)

## 2015-01-21 MED ORDER — ASPIRIN 300 MG RE SUPP
300.0000 mg | Freq: Once | RECTAL | Status: AC
  Administered 2015-01-21: 300 mg via RECTAL
  Filled 2015-01-21: qty 1

## 2015-01-21 MED ORDER — INSULIN GLARGINE 100 UNIT/ML ~~LOC~~ SOLN
30.0000 [IU] | Freq: Every day | SUBCUTANEOUS | Status: DC
  Administered 2015-01-21 – 2015-01-22 (×2): 30 [IU] via SUBCUTANEOUS
  Filled 2015-01-21 (×2): qty 0.3

## 2015-01-21 MED ORDER — POTASSIUM PHOSPHATES 15 MMOLE/5ML IV SOLN
30.0000 mmol | Freq: Once | INTRAVENOUS | Status: AC
  Administered 2015-01-21: 30 mmol via INTRAVENOUS
  Filled 2015-01-21: qty 10

## 2015-01-21 MED ORDER — SODIUM CHLORIDE 0.9 % IV BOLUS (SEPSIS): 1000.0000 mL | Freq: Once | INTRAVENOUS | Status: DC

## 2015-01-21 MED ORDER — SODIUM CHLORIDE 0.45 % IV SOLN
INTRAVENOUS | Status: DC
  Administered 2015-01-21 – 2015-01-23 (×4): via INTRAVENOUS
  Administered 2015-01-23: 1000 mL via INTRAVENOUS
  Administered 2015-01-23 – 2015-01-24 (×2): via INTRAVENOUS

## 2015-01-21 MED ORDER — DEXTROSE 5 % IV SOLN
INTRAVENOUS | Status: DC
  Administered 2015-01-21: 10:00:00 via INTRAVENOUS

## 2015-01-21 MED ORDER — HEPARIN (PORCINE) IN NACL 100-0.45 UNIT/ML-% IJ SOLN
1400.0000 [IU]/h | INTRAMUSCULAR | Status: DC
  Administered 2015-01-21: 1400 [IU]/h via INTRAVENOUS
  Filled 2015-01-21 (×2): qty 250

## 2015-01-21 MED ORDER — INSULIN ASPART 100 UNIT/ML ~~LOC~~ SOLN: 0.0000 [IU] | Freq: Three times a day (TID) | SUBCUTANEOUS | Status: DC

## 2015-01-21 MED ORDER — HEPARIN (PORCINE) IN NACL 100-0.45 UNIT/ML-% IJ SOLN
1000.0000 [IU]/h | INTRAMUSCULAR | Status: DC
  Filled 2015-01-21: qty 250

## 2015-01-21 MED ORDER — ASPIRIN 81 MG PO CHEW
81.0000 mg | CHEWABLE_TABLET | Freq: Every day | ORAL | Status: DC
Start: 2015-01-21 — End: 2015-01-21

## 2015-01-21 MED ORDER — INSULIN ASPART 100 UNIT/ML ~~LOC~~ SOLN
0.0000 [IU] | SUBCUTANEOUS | Status: DC
  Administered 2015-01-21: 15 [IU] via SUBCUTANEOUS
  Administered 2015-01-21: 5 [IU] via SUBCUTANEOUS
  Administered 2015-01-22: 8 [IU] via SUBCUTANEOUS
  Administered 2015-01-22: 11 [IU] via SUBCUTANEOUS
  Administered 2015-01-22 (×2): 8 [IU] via SUBCUTANEOUS
  Administered 2015-01-22: 11 [IU] via SUBCUTANEOUS
  Administered 2015-01-22: 8 [IU] via SUBCUTANEOUS
  Administered 2015-01-23: 15 [IU] via SUBCUTANEOUS
  Administered 2015-01-23: 5 [IU] via SUBCUTANEOUS
  Administered 2015-01-23: 8 [IU] via SUBCUTANEOUS

## 2015-01-21 NOTE — H&P (Signed)
PULMONARY / CRITICAL CARE MEDICINE   Name: Dave Cervantes MRN: [id number] DOB: August 30, 1951    ADMISSION DATE:  01/20/2015 CONSULTATION DATE:  01/20/2015  REFERRING MD :  EDP  CHIEF COMPLAINT:  AMS  INITIAL PRESENTATION: 64 year old male presented to [facility] ED 2/15 with AMS x 2 days. In ED was lethargic and hyperglycemic (Glu 1400) with positive urine ketones. PCCM called for admission.  STUDIES:    SIGNIFICANT EVENTS:   SUBJECTIVE: Overnight, anion gap closed and CBGs down. Transitioned to basal insulin (start at 10am). Troponin elevation up to 28.37, still trending. EKG this morning with new inverted t-waves in anterior leads and more prominent t-wave inversion in lateral leads. Patient lethargic and does not appear to be in pain.  VITAL SIGNS: Temp:  [94.5 F (34.7 C)-98.7 F (37.1 C)] 98.6 F (37 C) (02/16 0432) Pulse Rate:  [30-159] 102 (02/16 0600) Resp:  [18-31] 31 (02/16 0600) BP: (85-118)/(53-77) 106/63 mmHg (02/16 0600) SpO2:  [71 %-100 %] 90 % (02/16 0600) Arterial Line BP: (91-141)/(60-75) 91/64 mmHg (02/16 0600) Weight:  [233 lb 4 oz (105.8 kg)] 233 lb 4 oz (105.8 kg) (02/15 1700) HEMODYNAMICS:   VENTILATOR SETTINGS:   INTAKE / OUTPUT:  Intake/Output Summary (Last 24 hours) at 01/21/15 0644 Last data filed at 01/21/15 0600  Gross per 24 hour  Intake 3902.92 ml  Output   1890 ml  Net 2012.92 ml    PHYSICAL EXAMINATION: General:  Male of normal body habitus in NAD Neuro:  Lethargic, PERRL, no facial muscle paralysis, responds to voice but is not following commands, responds mildly to painful stimuli in upper extremities but not in lower extremities, upward babinski of left LE HEENT:  [place]/AT, no JVD Cardiovascular:  RRR, no MRG Lungs:  Clear bilateral breath sounds Abdomen:  Soft, non-tender, non-distended Musculoskeletal:  No acute deformities. No edema Skin:  Grossly intact  LABS:  CBC  Recent Labs Lab 01/20/15 1223 01/20/15 1249 01/21/15 0315  WBC  11.9*  --  12.1*  HGB 16.1 18.4* 15.3  HCT 52.7* 54.0* 44.1  PLT 248  --  202   Coag's No results for input(s): APTT, INR in the last 168 hours. BMET  Recent Labs Lab 01/20/15 2322 01/21/15 0315 01/21/15 0530  NA 157* 160* 163*  K 3.1* 3.2* 3.4*  CL 125* 129* >130*  CO2 BUN 93* 91* 88*  CREATININE 5.14* 4.32* 4.12*  GLUCOSE 519* 216* 144*   Electrolytes  Recent Labs Lab 01/20/15 1705  01/20/15 2322 01/21/15 0315 01/21/15 0530  CALCIUM  --   < > 8.6 8.9 8.9  MG 4.5*  --   --  4.0*  --   PHOS 4.9*  --   --  <1.0*  --   < > = values in this interval not displayed. Sepsis Markers  Recent Labs Lab 01/20/15 1249 01/20/15 1705  LATICACIDVEN 5.00* 2.8*   ABG No results for input(s): PHART, PCO2ART, PO2ART in the last 168 hours. Liver Enzymes  Recent Labs Lab 01/20/15 1404  AST 47*  ALT 29  ALKPHOS 88  BILITOT 2.4*  ALBUMIN 3.2*   Cardiac Enzymes  Recent Labs Lab 01/20/15 1705 01/20/15 2110 01/21/15 0315  TROPONINI 3.94* 12.15* 28.37*   Glucose  Recent Labs Lab 01/21/15 0312 01/21/15 0404 01/21/15 0503 01/21/15 0600 01/21/15 0654 01/21/15 0745  GLUCAP 199* 162* 135* 131* 130* 167*    Imaging Dg Chest Port 1 View  01/20/2015   CLINICAL DATA:  Respiratory failure  and altered mental status.  EXAM: PORTABLE CHEST - 1 VIEW  COMPARISON:  None.  FINDINGS: The mediastinal contour is normal. The heart size is upper limits of normal. The aorta is tortuous. The lung volumes are low. There is mild elevation of right hemidiaphragm. There is no focal infiltrate, pulmonary edema, or pleural effusion. The visualized skeletal structures are unremarkable.  IMPRESSION: No active cardiopulmonary disease.   Electronically Signed   By: Sherian ReinWei-Chen  Lin M.D.   On: 01/20/2015 16:30     ASSESSMENT / PLAN:  NEUROLOGIC A:   Acute metabolic encephalopathy Focal neurologic signs, ?central lesion   P:   RASS goal: 0 UDS negative Ammonia CT head w/o  contrast Neurology consult  CARDIOVASCULAR A:  H/o HTN NSTEMI  P:  Follow troponin Cardiology consult Hold heparin until negative CT Hold preadmission Amlodipine, HCTZ, lisinopril   RENAL A:   High AG metabolic acidosis - lactic, ketoacidosis Acute renal failure Hyperkalemia, resolved Hypokalemia  P:   K repletion Follow Bmet q 8 hours  GASTROINTESTINAL A:  Acute pancreatitis - lipase 2490; amylase 1500  P:   NPO SUP: IV Protonix Amylas lipase  PULMONARY A: No acute issues  P:   O2 to keep sats >92% Monitor  HEMATOLOGIC A:   Leukocytosis  P:  Follow CBC  INFECTIOUS A:  Leukocytosis; afebrile  P:   BCx2 2/15 >>> Urine 2/15 >>> Monitor  ENDOCRINE A:  Diabetic ketoacidosis DM, A1C of 13.7 ?Hypothyroidism - TSH wnl, on synthroid  P:   Transition to subq Insulin; turn off drip two hours s/p lantus IVF Hold synthroid until confirmation of outpt dose Hourly CBG monitoring   FAMILY  - Updates: No family at bedside  - Inter-disciplinary family meet or Palliative Care meeting due by:  2/22  Jacquelin Hawkingalph Nyari Olsson, MD PGY-2, [facility] 01/21/2015, 7:48 AM

## 2015-01-21 NOTE — Progress Notes (Signed)
ANTICOAGULATION CONSULT NOTE - Initial Consult  Pharmacy Consult:  Heparin Indication: chest pain/ACS  No Known Allergies  Patient Measurements: Height: 6' (182.9 cm) Weight: 233 lb 4 oz (105.8 kg) IBW/kg (Calculated) : 77.6 Heparin Dosing Weight: 100 kg  Vital Signs: Temp: 98.9 F (37.2 C) (02/16 0746) Temp Source: Oral (02/16 0746) BP: 105/69 mmHg (02/16 1200) Pulse Rate: 98 (02/16 1200)  Labs:  Recent Labs  01/20/15 1223 01/20/15 1239 01/20/15 1249  01/20/15 1705  01/20/15 2110 01/20/15 2322 01/21/15 0315 01/21/15 0530  HGB 16.1  --  18.4*  18.4*  --   --   --   --   --  15.3  --   HCT 52.7*  --  54.0*  54.0*  --   --   --   --   --  44.1  --   PLT 248  --   --   --   --   --   --   --  202  --   CREATININE  --   --  5.00*  5.00*  < >  --   < > 5.49* 5.14* 4.32* 4.12*  CKTOTAL  --  349*  --   --   --   --   --   --   --   --   TROPONINI  --   --   --   --  3.94*  --  12.15*  --  28.37*  --   < > = values in this interval not displayed.  Estimated Creatinine Clearance: 23.1 mL/min (by C-G formula based on Cr of 4.12).   Medical History: Past Medical History  Diagnosis Date  . Hypertension   . Hypothyroidism       Assessment: [age] YOM with positive cardiac enzymes to start IV heparin for ACS.  Baseline labs reviewed.  Noted patient received heparin SQ around 0600 today.   Goal of Therapy:  Heparin level 0.3-0.7 units/ml Monitor platelets by anticoagulation protocol: Yes    Plan:  - D/C heparin SQ - Heparin gtt at 1400 units/hr - Check 8 hr HL - Daily HL / CBC    Dilyn Smiles D. Laney Potashang, PharmD, BCPS Pager:  [phone] 01/21/2015, 1:18 PM

## 2015-01-21 NOTE — Procedures (Signed)
EEG report.  Brief clinical history: 64 YO male brought to hospital after 2 day history of AMS and then found slumped over with fecal and urinary incontinence. In hospital BG was found to be elevated at 1400 and unknown how long his BG had been this elevated. While in ICU patient has remained obtunded and showing left sided weakness. Etiology is unclear at this time.  Technique: this is a 17 channel routine scalp EEG performed at the bedside with bipolar and monopolar montages arranged in accordance to the international 10/20 system of electrode placement. One channel was dedicated to EKG recording.  No activating procedures performed.   Description: as the study opens and throughout the entire recording, the is diffuse, continuous, monomorphic theta activity that doesn't follow an ictal pattern at any time. No focal or generalized epileptiform discharges noted.  EKG showed sinus rhythm.  Impression: this is an abnormal EEG with findings consistent with a moderate encephalopathy, non specific as to cause. No electrographic seizures noted.  Clinical correlation is advised.   Dave Portelasvaldo Joniece Smotherman, MD [facility].

## 2015-01-21 NOTE — Consult Note (Signed)
NEURO HOSPITALIST CONSULT NOTE    Reason for Consult: confusion and decreased movement on the left UE and LE extremity  HPI:                                                                                                                                         Much of HPI obtained from Chart due to decreased MS   Dave Cervantes is an 64 y.o. male PMH significant for HTN and hypothyroidism followed at [place]. He presented to [facility] ED 2/15 following AMS x 2 days. Day of presentation his sister found him slumped in front of bedroom door. He was incontinent of urine. She called EMS and he was brought to [facility] ED where he was found to be severely hyperglycemic with glucose 1400. He was started on insulin gtt and provided volume with 2L NS. While in the hospital glucose has been decreased to 144 but his NA is 163, K+is 3.4, Cl >130, Cr has trended down from 5.23 to 4.12, Mg 4.0, Phos is <1.0.  Primary team noted he was not moving his left side as well as his right side and has called neurology to assist with continued AMS and left sided weakness.   UA and blood cultures pending.   Past Medical History  Diagnosis Date  . Hypertension   . Hypothyroidism     No past surgical history on file.  Family history:  Unavailable due to patient's mental status, and not documented otherwise in patient's chart.  Social History: Unavailable from patient due to abnormal mental status;  has no tobacco, alcohol, and drug history on file.  No Known Allergies  MEDICATIONS:                                                                                                                     Prior to Admission:  No prescriptions prior to admission   Scheduled: . antiseptic oral rinse  7 mL Mouth Rinse BID  . heparin  5,000 Units Subcutaneous 3 times per day  . insulin aspart  0-15 Units Subcutaneous TID WC  . insulin glargine  30 Units Subcutaneous Daily  . pantoprazole (PROTONIX) IV  40 mg  Intravenous Q24H  . potassium phosphate IVPB (mmol)  30 mmol Intravenous Once  ROS:                                                                                                                                       History obtained from unobtainable from patient due to mental status    Blood pressure 99/70, pulse 98, temperature 98.9 F (37.2 C), temperature source Oral, resp. rate 28, height 6' (1.829 m), weight 105.8 kg (233 lb 4 oz), SpO2 90 %.   Neurologic Examination:                                                                                                      HEENT-  Normocephalic, no lesions, without obvious abnormality.  Normal external eye and conjunctiva.  Normal TM's bilaterally.  Normal auditory canals and external ears. Normal external nose, mucus membranes and septum.  Normal pharynx. Cardiovascular- S1, S2 normal, pulses palpable throughout   Lungs- no tachypnea, retractions or cyanosis Abdomen- normal findings: bowel sounds normal Extremities- no edema Lymph-no adenopathy palpable Musculoskeletal-no joint tenderness, deformity or swelling Skin-warm and dry, no hyperpigmentation, vitiligo, or suspicious lesions  Neurological Examination Mental Status: Only responds to sternal rub.  When sternal rub is administered he grimaced symmetrically and localizes with his right arm.   Cranial Nerves: II: no blink to threat bilaterally, pupils equal, round, reactive to light and accommodation III,IV, VI: ptosis not present, doll's response intact V,VII: facial grimace symmetric, facial light touch sensation normal bilaterally VIII: hearing normal bilaterally IX,X: gag reflex present  Motor: Lifts the right arm antigravity and localizes to sternal rub with his right arm, no movement of left arm noted even when noxious stimuli given.  Withdraws his right leg but not antigravity to pain.  No movement of left leg noted.  Sensory: intact to noxious stimuli  throughout Deep Tendon Reflexes: 1+ and symmetric throughout Plantars: Mute bilaterally Cerebellar: Unable to assess Gait: unable to assess      Lab Results: Basic Metabolic Panel:  Recent Labs Lab 01/20/15 1705 01/20/15 1846 01/20/15 2110 01/20/15 2322 01/21/15 0315 01/21/15 0530  NA  --  147* 154* 157* 160* 163*  K  --  5.5* 3.7 3.1* 3.2* 3.4*  CL  --  109 118* 125* 129* >130*  CO2  --  13* 10* 20 20 20   GLUCOSE  --  1134* 825* 519* 216* 144*  BUN  --  94* 97* 93* 91* 88*  CREATININE  --  5.53* 5.49* 5.14* 4.32* 4.12*  CALCIUM  --  8.9 8.9 8.6 8.9 8.9  MG 4.5*  --   --   --  4.0*  --   PHOS 4.9*  --   --   --  <1.0*  --     Liver Function Tests:  Recent Labs Lab 01/20/15 1404  AST 47*  ALT 29  ALKPHOS 88  BILITOT 2.4*  PROT 6.4  ALBUMIN 3.2*    Recent Labs Lab 01/20/15 1705  LIPASE 2490*  AMYLASE 1500*   No results for input(s): AMMONIA in the last 168 hours.  CBC:  Recent Labs Lab 01/20/15 1223 01/20/15 1249 01/21/15 0315  WBC 11.9*  --  12.1*  NEUTROABS 9.4*  --   --   HGB 16.1 18.4*  18.4* 15.3  HCT 52.7* 54.0*  54.0* 44.1  MCV 109.1*  --  95.7  PLT 248  --  202    Cardiac Enzymes:  Recent Labs Lab 01/20/15 1239 01/20/15 1705 01/20/15 2110 01/21/15 0315  CKTOTAL 349*  --   --   --   TROPONINI  --  3.94* 12.15* 28.37*    Lipid Panel: No results for input(s): CHOL, TRIG, HDL, CHOLHDL, VLDL, LDLCALC in the last 168 hours.  CBG:  Recent Labs Lab 01/21/15 0404 01/21/15 0503 01/21/15 0600 01/21/15 0654 01/21/15 0745  GLUCAP 162* 135* 131* 130* 167*    Microbiology: Results for orders placed or performed during the hospital encounter of 01/20/15  Blood culture (routine x 2)     Status: None (Preliminary result)   Collection Time: 01/20/15 12:34 PM  Result Value Ref Range Status   Specimen Description BLOOD RIGHT ANTECUBITAL  Final   Special Requests BOTTLES DRAWN AEROBIC ONLY 4CC  Final   Culture   Final            BLOOD CULTURE RECEIVED NO GROWTH TO DATE CULTURE WILL BE HELD FOR 5 DAYS BEFORE ISSUING A FINAL NEGATIVE REPORT Performed at [facility]    Report Status PENDING  Incomplete  Blood culture (routine x 2)     Status: None (Preliminary result)   Collection Time: 01/20/15  3:31 PM  Result Value Ref Range Status   Specimen Description BLOOD LEFT WRIST  Final   Special Requests BOTTLES DRAWN AEROBIC AND ANAEROBIC 3CC  Final   Culture   Final           BLOOD CULTURE RECEIVED NO GROWTH TO DATE CULTURE WILL BE HELD FOR 5 DAYS BEFORE ISSUING A FINAL NEGATIVE REPORT Performed at [facility]    Report Status PENDING  Incomplete  MRSA PCR Screening     Status: None   Collection Time: 01/20/15  5:10 PM  Result Value Ref Range Status   MRSA by PCR NEGATIVE NEGATIVE Final    Comment:        The GeneXpert MRSA Assay (FDA approved for NASAL specimens only), is one component of a comprehensive MRSA colonization surveillance program. It is not intended to diagnose MRSA infection nor to guide or monitor treatment for MRSA infections.     Coagulation Studies: No results for input(s): LABPROT, INR in the last 72 hours.  Imaging: Dg Chest Port 1 View  01/21/2015   CLINICAL DATA:  Respiratory failure  EXAM: PORTABLE CHEST - 1 VIEW  COMPARISON:  01/20/2015  FINDINGS: A single AP portable view of the chest demonstrates no focal airspace consolidation or alveolar edema. The lungs are grossly clear. There is no large effusion or pneumothorax. Cardiac and mediastinal contours appear unremarkable.  There is no significant interval change.  IMPRESSION: No active cardiopulmonary disease   Electronically Signed   By: Ellery Plunk M.D.   On: 01/21/2015 06:02   Dg Chest Port 1 View  01/20/2015   CLINICAL DATA:  Respiratory failure and altered mental status.  EXAM: PORTABLE CHEST - 1 VIEW  COMPARISON:  None.  FINDINGS: The mediastinal contour is normal. The heart size is upper limits of  normal. The aorta is tortuous. The lung volumes are low. There is mild elevation of right hemidiaphragm. There is no focal infiltrate, pulmonary edema, or pleural effusion. The visualized skeletal structures are unremarkable.  IMPRESSION: No active cardiopulmonary disease.   Electronically Signed   By: Sherian Rein M.D.   On: 01/20/2015 16:30       Assessment and plan per attending neurologist  Felicie Morn PA-C [facility] [phone]  01/21/2015, 11:26 AM   Assessment/Plan:  64 YO male brought to hospital after 2 day history of AMS and then found slumped over with fecal and urinary incontinence. In hospital BG was found to be elevated at 1400 and unknown how long his BG had been this elevated. While in ICU patient has remained obtunded and showing left sided weakness.  Etiology is unclear at this time however, cannot rule out possible CVA, venous thrombus versus  prolonged encephalopathic state secondary to metabolic abnormalities.  Recommend: 1) MRI, MRA, MRV brain 2) EEG 3) Continue to treat hypernatremia and possible infectious etiologies.   I personally participated in this patient's evaluation and management, including formulating the above clinical impression and management recommendations.  Venetia Maxon M.D. [facility] [phone]

## 2015-01-21 NOTE — Progress Notes (Signed)
ANTICOAGULATION CONSULT NOTE - Follow Up Consult  Pharmacy Consult for heparin Indication: NSTEMI  Labs:  Recent Labs  01/20/15 1223 01/20/15 1239 01/20/15 1249  01/21/15 0315 01/21/15 0530 01/21/15 0921 01/21/15 1200 01/21/15 1521 01/21/15 2103 01/21/15 2215  HGB 16.1  --  18.4*  18.4*  --  15.3  --   --   --   --   --   --   HCT 52.7*  --  54.0*  54.0*  --  44.1  --   --   --   --   --   --   PLT 248  --   --   --  202  --   --   --   --   --   --   HEPARINUNFRC  --   --   --   --   --   --   --   --   --   --  1.34*  CREATININE  --   --  5.00*  5.00*  < > 4.32* 4.12*  --  3.81*  --  3.57*  --   CKTOTAL  --  349*  --   --   --   --   --   --   --   --   --   TROPONINI  --   --   --   < > 28.37*  --  32.19*  --  25.20* 20.78*  --   < > = values in this interval not displayed.   Assessment: 63yo male supratherapeutic on heparin with initial dosing for NSTEMI; drawn appropriately and no overt bleeding issues per RN.  Goal of Therapy:  Heparin level 0.3-0.7 units/ml   Plan:  Will hold heparin gtt x1hr then resume at 4 units/kg/hr lower at 1000 units/hr and check level in 8hr.  Vernard GamblesVeronda Drystan Reader, PharmD, BCPS  01/21/2015,11:27 PM

## 2015-01-21 NOTE — Progress Notes (Signed)
Heparin Level this pm came back at 1.34  Pharmacy called and made aware. New orders received. Will continue to monitor.

## 2015-01-21 NOTE — Progress Notes (Signed)
UR Completed.  [phone]  

## 2015-01-21 NOTE — Progress Notes (Signed)
[facility] and placed in shadow chart.  Jacqulyn Canehristopher Scott Lamyah Creed RN, BSN, CCRN

## 2015-01-21 NOTE — Progress Notes (Signed)
EEG completed; results pending.    

## 2015-01-21 NOTE — Progress Notes (Signed)
eLink Physician-Brief Progress Note Patient Name: Corwin LevinsWilliam Mkrtchyan DOB: 1951-11-03 MRN: [id number]   Date of Service  01/21/2015  HPI/Events of Note  Hypokalemia and hypophosphatemia Gap is closed, Mental status improving Troponin 28, denies chest pain> presumed demand ischemia  eICU Interventions  Repeat EKG now ASA now Kphos now Insulin Transition orders written      Intervention Category Intermediate Interventions: Electrolyte abnormality - evaluation and management;Hyperglycemia - evaluation and treatment;Diagnostic test evaluation  MCQUAID, DOUGLAS 01/21/2015, 4:55 AM

## 2015-01-21 NOTE — Progress Notes (Signed)
CRITICAL VALUE ALERT  Critical value received:  NA+ 163, Chloride >130  Date of notification:  01/21/15  Time of notification:  0630  Critical value read back:Yes.    Nurse who received alert:  Carolyne FiscalK. Sahalie Beth   MD notified (1st page): Dr. Kendrick FriesMcQuaid  Time of first page:  0630  Responding MD:  Dr. Kendrick FriesMcQuaid  Time MD responded:  0630

## 2015-01-22 ENCOUNTER — Inpatient Hospital Stay (HOSPITAL_COMMUNITY): Payer: Non-veteran care

## 2015-01-22 DIAGNOSIS — I214 Non-ST elevation (NSTEMI) myocardial infarction: Secondary | ICD-10-CM

## 2015-01-22 DIAGNOSIS — G9341 Metabolic encephalopathy: Secondary | ICD-10-CM | POA: Diagnosis present

## 2015-01-22 DIAGNOSIS — R4182 Altered mental status, unspecified: Secondary | ICD-10-CM | POA: Diagnosis present

## 2015-01-22 DIAGNOSIS — I213 ST elevation (STEMI) myocardial infarction of unspecified site: Secondary | ICD-10-CM

## 2015-01-22 DIAGNOSIS — E1011 Type 1 diabetes mellitus with ketoacidosis with coma: Secondary | ICD-10-CM | POA: Insufficient documentation

## 2015-01-22 DIAGNOSIS — E0810 Diabetes mellitus due to underlying condition with ketoacidosis without coma: Secondary | ICD-10-CM

## 2015-01-22 LAB — BASIC METABOLIC PANEL
BUN: 60 mg/dL — AB (ref 6–23)
BUN: 59 mg/dL — ABNORMAL HIGH (ref 6–23)
CALCIUM: 7.9 mg/dL — AB (ref 8.4–10.5)
CO2: 19 mmol/L (ref 19–32)
CO2: 18 mmol/L — ABNORMAL LOW (ref 19–32)
Calcium: 7.8 mg/dL — ABNORMAL LOW (ref 8.4–10.5)
Chloride: >130 mmol/L (ref 96–112)
Chloride: >130 mmol/L (ref 96–112)
Creatinine, Ser: 2.87 mg/dL — ABNORMAL HIGH (ref 0.50–1.35)
Creatinine, Ser: 2.75 mg/dL — ABNORMAL HIGH (ref 0.50–1.35)
GFR calc Af Amer: 25 mL/min — ABNORMAL LOW (ref >90)
GFR calc Af Amer: 27 mL/min — ABNORMAL LOW (ref >90)
GFR calc non Af Amer: 22 mL/min — ABNORMAL LOW (ref >90)
GFR calc non Af Amer: 23 mL/min — ABNORMAL LOW (ref >90)
GLUCOSE: 341 mg/dL — AB (ref 70–99)
Glucose, Bld: 327 mg/dL — ABNORMAL HIGH (ref 70–99)
POTASSIUM: 3.8 mmol/L (ref 3.5–5.1)
Potassium: 3.6 mmol/L (ref 3.5–5.1)
SODIUM: 159 mmol/L — AB (ref 135–145)
Sodium: 163 mmol/L (ref 135–145)

## 2015-01-22 LAB — COMPREHENSIVE METABOLIC PANEL
ALBUMIN: 3.2 g/dL — AB (ref 3.5–5.2)
ALT: 37 U/L (ref 0–53)
AST: 125 U/L — AB (ref 0–37)
Alkaline Phosphatase: 76 U/L (ref 39–117)
BUN: 67 mg/dL — ABNORMAL HIGH (ref 6–23)
CO2: 23 mmol/L (ref 19–32)
Calcium: 8.2 mg/dL — ABNORMAL LOW (ref 8.4–10.5)
Creatinine, Ser: 3.03 mg/dL — ABNORMAL HIGH (ref 0.50–1.35)
GFR calc Af Amer: 24 mL/min — ABNORMAL LOW (ref >90)
GFR calc non Af Amer: 20 mL/min — ABNORMAL LOW (ref >90)
Glucose, Bld: 333 mg/dL — ABNORMAL HIGH (ref 70–99)
POTASSIUM: 3.6 mmol/L (ref 3.5–5.1)
SODIUM: 160 mmol/L — AB (ref 135–145)
TOTAL PROTEIN: 6.3 g/dL (ref 6.0–8.3)
Total Bilirubin: 1 mg/dL (ref 0.3–1.2)

## 2015-01-22 LAB — CBC
HEMATOCRIT: 42.8 % (ref 39.0–52.0)
Hemoglobin: 14.4 g/dL (ref 13.0–17.0)
MCH: 32.4 pg (ref 26.0–34.0)
MCHC: 33.6 g/dL (ref 30.0–36.0)
MCV: 96.2 fL (ref 78.0–100.0)
PLATELETS: 151 10*3/uL (ref 150–400)
RBC: 4.45 MIL/uL (ref 4.22–5.81)
RDW: 14.8 % (ref 11.5–15.5)
WBC: 11.6 10*3/uL — AB (ref 4.0–10.5)

## 2015-01-22 LAB — GLUCOSE, CAPILLARY
GLUCOSE-CAPILLARY: 255 mg/dL — AB (ref 70–99)
GLUCOSE-CAPILLARY: 267 mg/dL — AB (ref 70–99)
GLUCOSE-CAPILLARY: 288 mg/dL — AB (ref 70–99)
GLUCOSE-CAPILLARY: 342 mg/dL — AB (ref 70–99)
Glucose-Capillary: 285 mg/dL — ABNORMAL HIGH (ref 70–99)
Glucose-Capillary: 319 mg/dL — ABNORMAL HIGH (ref 70–99)

## 2015-01-22 LAB — TROPONIN I
Troponin I: 15.45 ng/mL (ref <0.031)
Troponin I: 12.96 ng/mL (ref <0.031)
Troponin I: 11.37 ng/mL (ref <0.031)
Troponin I: 10.61 ng/mL (ref <0.031)

## 2015-01-22 LAB — PHOSPHORUS: PHOSPHORUS: 2.4 mg/dL (ref 2.3–4.6)

## 2015-01-22 LAB — HEPARIN LEVEL (UNFRACTIONATED)
HEPARIN UNFRACTIONATED: 1.22 [IU]/mL — AB (ref 0.30–0.70)
Heparin Unfractionated: 1.4 IU/mL — ABNORMAL HIGH (ref 0.30–0.70)

## 2015-01-22 LAB — MAGNESIUM: Magnesium: 3.4 mg/dL — ABNORMAL HIGH (ref 1.5–2.5)

## 2015-01-22 LAB — LIPASE, BLOOD: Lipase: 313 U/L — ABNORMAL HIGH (ref 11–59)

## 2015-01-22 MED ORDER — METOPROLOL TARTRATE 25 MG PO TABS
25.0000 mg | ORAL_TABLET | Freq: Three times a day (TID) | ORAL | Status: DC
  Administered 2015-01-22 – 2015-01-29 (×21): 25 mg via ORAL
  Filled 2015-01-22 (×24): qty 1

## 2015-01-22 MED ORDER — CHLORHEXIDINE GLUCONATE 0.12 % MT SOLN
15.0000 mL | Freq: Two times a day (BID) | OROMUCOSAL | Status: DC
  Administered 2015-01-22 – 2015-01-23 (×2): 15 mL via OROMUCOSAL
  Filled 2015-01-22 (×3): qty 15

## 2015-01-22 MED ORDER — LEVOTHYROXINE SODIUM 100 MCG IV SOLR
75.0000 ug | Freq: Every day | INTRAVENOUS | Status: DC
  Administered 2015-01-22: 75 ug via INTRAVENOUS
  Filled 2015-01-22 (×2): qty 5

## 2015-01-22 MED ORDER — ASPIRIN 81 MG PO CHEW
81.0000 mg | CHEWABLE_TABLET | Freq: Every day | ORAL | Status: DC
  Administered 2015-01-22 – 2015-01-29 (×7): 81 mg via ORAL
  Filled 2015-01-22 (×9): qty 1

## 2015-01-22 MED ORDER — ATORVASTATIN CALCIUM 80 MG PO TABS
80.0000 mg | ORAL_TABLET | Freq: Every day | ORAL | Status: DC
  Administered 2015-01-22 – 2015-01-29 (×8): 80 mg via ORAL
  Filled 2015-01-22 (×8): qty 1

## 2015-01-22 MED ORDER — CETYLPYRIDINIUM CHLORIDE 0.05 % MT LIQD
7.0000 mL | Freq: Two times a day (BID) | OROMUCOSAL | Status: DC
  Administered 2015-01-22 (×2): 7 mL via OROMUCOSAL

## 2015-01-22 MED ORDER — INSULIN GLARGINE 100 UNIT/ML ~~LOC~~ SOLN
20.0000 [IU] | Freq: Two times a day (BID) | SUBCUTANEOUS | Status: DC
  Administered 2015-01-22: 20 [IU] via SUBCUTANEOUS
  Filled 2015-01-22 (×3): qty 0.2

## 2015-01-22 NOTE — Progress Notes (Signed)
  Echocardiogram 2D Echocardiogram has been performed.  Dave BeamsGREGORY, Dave Cervantes 01/22/2015, 2:57 PM

## 2015-01-22 NOTE — H&P (Deleted)
PULMONARY / CRITICAL CARE MEDICINE   Name: Dave Cervantes MRN: [id number] DOB: 10-23-1951    ADMISSION DATE:  01/20/2015 CONSULTATION DATE:  01/20/2015  REFERRING MD :  EDP  CHIEF COMPLAINT:  AMS  INITIAL PRESENTATION: 64 year old male presented to [facility] ED 2/15 with AMS x 2 days. In ED was lethargic and hyperglycemic (Glu 1400) with positive urine ketones. PCCM called for admission.  STUDIES:    SIGNIFICANT EVENTS:   SUBJECTIVE:  Patient's blood sugars not well controlled after transition to 30u basal (morning fasting not obtained yet) CT, MRI, MRA not suggestive of acute process for encephalopathy Patient afebrile Alert, oriented x2 today, no chest pain, no shortness of breath Lipase down to 313 Foley replaced with condom cath due to obstructed catheter; hematuria noticed this AM s/p change   VITAL SIGNS: Temp:  [97.9 F (36.6 C)-98.9 F (37.2 C)] 98.9 F (37.2 C) (02/17 0417) Pulse Rate:  [91-106] 102 (02/17 0700) Resp:  [15-33] 25 (02/17 0700) BP: (90-130)/(63-83) 116/80 mmHg (02/17 0600) SpO2:  [90 %-100 %] 98 % (02/17 0700) Arterial Line BP: (86-158)/(60-89) 112/76 mmHg (02/17 0700) HEMODYNAMICS:   VENTILATOR SETTINGS:   INTAKE / OUTPUT:  Intake/Output Summary (Last 24 hours) at 01/22/15 0729 Last data filed at 01/22/15 0700  Gross per 24 hour  Intake 2997.7 ml  Output   1525 ml  Net 1472.7 ml    PHYSICAL EXAMINATION: General:  Male of normal body habitus in NAD Neuro:  Alert, CN intact, no focal deficit noticed, responds to commands HEENT:  [place]/AT, no JVD Cardiovascular:  RRR, no MRG Lungs:  Clear bilateral breath sounds Abdomen:  Soft, non-tender, non-distended Musculoskeletal:  No acute deformities. No edema Skin:  Grossly intact  LABS:  CBC  Recent Labs Lab 01/20/15 1223 01/20/15 1249 01/21/15 0315 01/22/15 0431  WBC 11.9*  --  12.1* 11.6*  HGB 16.1 18.4*  18.4* 15.3 14.4  HCT 52.7* 54.0*  54.0* 44.1 42.8  PLT 248  --  202 PENDING    Coag's No results for input(s): APTT, INR in the last 168 hours. BMET  Recent Labs Lab 01/21/15 0530 01/21/15 1200 01/21/15 2103  NA 163* 161* 158*  K 3.4* 3.5 3.7  CL >130* >130* 129*  CO2 20 22 17*  BUN 88* 83* 78*  CREATININE 4.12* 3.81* 3.57*  GLUCOSE 144* 151* 442*   Electrolytes  Recent Labs Lab 01/20/15 1705  01/21/15 0315 01/21/15 0530 01/21/15 1200 01/21/15 2103 01/22/15 0314  CALCIUM  --   < > 8.9 8.9 8.7 8.1*  --   MG 4.5*  --  4.0*  --   --   --  3.4*  PHOS 4.9*  --  <1.0*  --   --   --  2.4  < > = values in this interval not displayed. Sepsis Markers  Recent Labs Lab 01/20/15 1249 01/20/15 1705  LATICACIDVEN 5.00* 2.8*   ABG No results for input(s): PHART, PCO2ART, PO2ART in the last 168 hours. Liver Enzymes  Recent Labs Lab 01/20/15 1404  AST 47*  ALT 29  ALKPHOS 88  BILITOT 2.4*  ALBUMIN 3.2*    Cardiac Enzymes  Recent Labs Lab 01/21/15 1521 01/21/15 2103 01/22/15 0314  TROPONINI 25.20* 20.78* 15.45*   Glucose  Recent Labs Lab 01/21/15 1244 01/21/15 1346 01/21/15 1557 01/21/15 2010 01/22/15 0016 01/22/15 0319  GLUCAP 143* 144* 229* 391* 342* 285*    Imaging Ct Head Wo Contrast  01/21/2015   CLINICAL DATA:  Initial evaluation of  64 year old male with altered mental status.  EXAM: CT HEAD WITHOUT CONTRAST  TECHNIQUE: Contiguous axial images were obtained from the base of the skull through the vertex without intravenous contrast.  COMPARISON:  No priors.  FINDINGS: Patchy and confluent areas of decreased attenuation are noted throughout the deep and periventricular white matter of the cerebral hemispheres bilaterally, compatible with chronic microvascular ischemic disease. No acute intracranial abnormalities. Specifically, no evidence of acute intracranial hemorrhage, no definite findings of acute/subacute cerebral ischemia, no mass, mass effect, hydrocephalus or abnormal intra or extra-axial fluid collections. Visualized  paranasal sinuses and mastoids are well pneumatized. No acute displaced skull fractures are identified.  IMPRESSION: 1. No acute intracranial abnormalities. 2. Mild chronic microvascular ischemic changes in the cerebral white matter.   Electronically Signed   By: Trudie Reed M.D.   On: 01/21/2015 11:59   Mr [name] Head Wo Contrast  01/22/2015   CLINICAL DATA:  Initial evaluation for altered mental status with left-sided weakness.  EXAM: MRI HEAD WITHOUT CONTRAST  MRA HEAD WITHOUT CONTRAST  TECHNIQUE: Multiplanar, multiecho pulse sequences of the brain and surrounding structures were obtained without intravenous contrast. Angiographic images of the head were obtained using MRA technique without contrast.  COMPARISON:  Prior CT from 01/21/2015.  FINDINGS: MRI HEAD FINDINGS  Study is degraded by motion artifact.  Mild diffuse prominence of the CSF containing spaces compatible with generalized cerebral atrophy. Mild confluent T2 S FLAIR hyperintensity within the periventricular white matter noted, nonspecific, but likely related to mild chronic small vessel ischemic changes.  No abnormal foci of restricted diffusion to suggest acute intracranial infarct. Gray-white matter differentiation maintained. No acute intracranial hemorrhage. Normal intravascular flow voids are present.  No mass lesion or midline shift. No mass effect. No hydrocephalus. No extra-axial fluid collection.  Craniocervical junction within normal limits. Pituitary gland normal. No acute abnormality seen about the orbits.  Mild mucoperiosteal thickening present within the ethmoidal air cells and maxillary sinuses. No air-fluid levels to suggest active sinus infection. No mastoid effusion.  Bone marrow signal intensity is normal. No acute abnormality seen within the scalp soft tissues.  MRA HEAD FINDINGS  ANTERIOR CIRCULATION:  The visualized distal cervical segments of the internal carotid arteries are widely patent with antegrade flow. The petrous  segments widely patent. Multi focal atheromatous irregularity present within the cavernous segments of the internal carotid arteries bilaterally, greater on the left. There is a short-segment moderate stenosis within the anterior genu of the cavernous left ICA (series 6, image 93). There is no more mild to moderate stenosis within the proximal aspect of the cavernous right ICA (series 6, image 83). Supraclinoid segments widely patent.  A1 segments, anterior communicating artery, and anterior cerebral arteries well opacified.  Right M1 segment well opacified without flow-limiting stenosis or occlusion. Right MCA bifurcation normal. Mild distal branch irregularity present within the right MCA branches.  The left M1 segment bifurcates early. There is a superimposed short-segment moderate stenosis within the left M1 segment just proximal to the bifurcation (series 6, image 102). Distal branch atherosclerotic irregularity seen distally within the left MCA branches.  POSTERIOR CIRCULATION:  Left vertebral artery is dominant and widely patent to the vertebrobasilar junction. There is a short segment mild stenosis within the distal left vertebral artery just proximal to the vertebrobasilar junction.  The diminutive right vertebral artery is markedly irregular with attenuated flow and multi focal moderate to severe stenoses. The distal right vertebral artery may be partially excluded Prior to the vertebrobasilar junction.  The posterior inferior cerebral arteries are patent proximally.  Multi focal atheromatous irregularity present within the proximal basilar artery without significant stenosis. Superior cerebellar arteries are patent proximally. There appears to be atheromatous irregularity distally within the SCAs. P1 and P2 segments well opacified bilaterally. There is distal branch atheromatous irregularity within the PCAs bilaterally.  No aneurysm or vascular malformation.  IMPRESSION: MRI HEAD IMPRESSION:  1. No acute  intracranial infarct or other abnormality identified. 2. Generalized atrophy with mild chronic small vessel ischemic disease. 3. Please note that and MRV was not performed on this exam due to motion artifact and patient's inability to tolerate the length of the exam.  MRA HEAD IMPRESSION:  1. No proximal branch occlusion identified within the intracranial circulation. 2. Short-segment focal moderate stenoses within the cavernous ICAs and left M1 segment. 3. Heavy atheromatous disease within the right vertebral artery with secondary multi focal irregularity and attenuated flow related enhancement. 4. Widely patent left vertebral artery. 5. Mild to moderate atheromatous irregularity within the proximal basilar artery without flow-limiting stenosis. 6. Distal branch atheromatous irregularity within the MCA and PCA branches bilaterally.   Electronically Signed   By: Rise MuBenjamin  McClintock M.D.   On: 01/22/2015 04:47   Mr Brain Wo Contrast  01/22/2015   CLINICAL DATA:  Initial evaluation for altered mental status with left-sided weakness.  EXAM: MRI HEAD WITHOUT CONTRAST  MRA HEAD WITHOUT CONTRAST  TECHNIQUE: Multiplanar, multiecho pulse sequences of the brain and surrounding structures were obtained without intravenous contrast. Angiographic images of the head were obtained using MRA technique without contrast.  COMPARISON:  Prior CT from 01/21/2015.  FINDINGS: MRI HEAD FINDINGS  Study is degraded by motion artifact.  Mild diffuse prominence of the CSF containing spaces compatible with generalized cerebral atrophy. Mild confluent T2 S FLAIR hyperintensity within the periventricular white matter noted, nonspecific, but likely related to mild chronic small vessel ischemic changes.  No abnormal foci of restricted diffusion to suggest acute intracranial infarct. Gray-white matter differentiation maintained. No acute intracranial hemorrhage. Normal intravascular flow voids are present.  No mass lesion or midline shift. No  mass effect. No hydrocephalus. No extra-axial fluid collection.  Craniocervical junction within normal limits. Pituitary gland normal. No acute abnormality seen about the orbits.  Mild mucoperiosteal thickening present within the ethmoidal air cells and maxillary sinuses. No air-fluid levels to suggest active sinus infection. No mastoid effusion.  Bone marrow signal intensity is normal. No acute abnormality seen within the scalp soft tissues.  MRA HEAD FINDINGS  ANTERIOR CIRCULATION:  The visualized distal cervical segments of the internal carotid arteries are widely patent with antegrade flow. The petrous segments widely patent. Multi focal atheromatous irregularity present within the cavernous segments of the internal carotid arteries bilaterally, greater on the left. There is a short-segment moderate stenosis within the anterior genu of the cavernous left ICA (series 6, image 93). There is no more mild to moderate stenosis within the proximal aspect of the cavernous right ICA (series 6, image 83). Supraclinoid segments widely patent.  A1 segments, anterior communicating artery, and anterior cerebral arteries well opacified.  Right M1 segment well opacified without flow-limiting stenosis or occlusion. Right MCA bifurcation normal. Mild distal branch irregularity present within the right MCA branches.  The left M1 segment bifurcates early. There is a superimposed short-segment moderate stenosis within the left M1 segment just proximal to the bifurcation (series 6, image 102). Distal branch atherosclerotic irregularity seen distally within the left MCA branches.  POSTERIOR CIRCULATION:  Left vertebral artery  is dominant and widely patent to the vertebrobasilar junction. There is a short segment mild stenosis within the distal left vertebral artery just proximal to the vertebrobasilar junction.  The diminutive right vertebral artery is markedly irregular with attenuated flow and multi focal moderate to severe  stenoses. The distal right vertebral artery may be partially excluded Prior to the vertebrobasilar junction.  The posterior inferior cerebral arteries are patent proximally.  Multi focal atheromatous irregularity present within the proximal basilar artery without significant stenosis. Superior cerebellar arteries are patent proximally. There appears to be atheromatous irregularity distally within the SCAs. P1 and P2 segments well opacified bilaterally. There is distal branch atheromatous irregularity within the PCAs bilaterally.  No aneurysm or vascular malformation.  IMPRESSION: MRI HEAD IMPRESSION:  1. No acute intracranial infarct or other abnormality identified. 2. Generalized atrophy with mild chronic small vessel ischemic disease. 3. Please note that and MRV was not performed on this exam due to motion artifact and patient's inability to tolerate the length of the exam.  MRA HEAD IMPRESSION:  1. No proximal branch occlusion identified within the intracranial circulation. 2. Short-segment focal moderate stenoses within the cavernous ICAs and left M1 segment. 3. Heavy atheromatous disease within the right vertebral artery with secondary multi focal irregularity and attenuated flow related enhancement. 4. Widely patent left vertebral artery. 5. Mild to moderate atheromatous irregularity within the proximal basilar artery without flow-limiting stenosis. 6. Distal branch atheromatous irregularity within the MCA and PCA branches bilaterally.   Electronically Signed   By: Rise Mu M.D.   On: 01/22/2015 04:47   Dg Chest Port 1 View  01/21/2015   CLINICAL DATA:  Respiratory failure  EXAM: PORTABLE CHEST - 1 VIEW  COMPARISON:  01/20/2015  FINDINGS: A single AP portable view of the chest demonstrates no focal airspace consolidation or alveolar edema. The lungs are grossly clear. There is no large effusion or pneumothorax. Cardiac and mediastinal contours appear unremarkable.  There is no significant  interval change.  IMPRESSION: No active cardiopulmonary disease   Electronically Signed   By: Ellery Plunk M.D.   On: 01/21/2015 06:02   Dg Chest Port 1 View  01/20/2015   CLINICAL DATA:  Respiratory failure and altered mental status.  EXAM: PORTABLE CHEST - 1 VIEW  COMPARISON:  None.  FINDINGS: The mediastinal contour is normal. The heart size is upper limits of normal. The aorta is tortuous. The lung volumes are low. There is mild elevation of right hemidiaphragm. There is no focal infiltrate, pulmonary edema, or pleural effusion. The visualized skeletal structures are unremarkable.  IMPRESSION: No active cardiopulmonary disease.   Electronically Signed   By: Sherian Rein M.D.   On: 01/20/2015 16:30     ASSESSMENT / PLAN:  NEUROLOGIC A:   Acute metabolic encephalopathy Focal neurologic signs, ?central lesion   P:   RASS goal: 0 UDS negative Neurology consult, appreciate recommendations EEG shows encephalopathy  CARDIOVASCULAR A:  H/o HTN NSTEMI  P:  Follow troponin Cardiology consult Heparin gtt Hold preadmission Amlodipine, HCTZ, lisinopril  RENAL A:   High AG metabolic acidosis - lactic, ketoacidosis Acute renal failure, baseline Cr of 1.5 in 07/2014 Hyperkalemia, resolved Hypokalemia  P:   K repletion Follow Bmet q 8 hours  GASTROINTESTINAL A:  Acute pancreatitis - improved  P:   NPO SUP: IV Protonix  PULMONARY A: No acute issues  P:   O2 to keep sats >92% Monitor  HEMATOLOGIC A:   Leukocytosis  P:  Follow CBC  INFECTIOUS A:  Leukocytosis; afebrile  P:   BCx2 2/15 >>> Urine 2/15 >>> Monitor  ENDOCRINE A:  Diabetic ketoacidosis DM, A1C of 13.7 Hypothyroidism  P:   Increase to Lantus 38 units daily, continue SSI IVF Restart home synthroid IV (Half dose as IV) CBG q4 hours    FAMILY  - Updates: No family at bedside  - Inter-disciplinary family meet or Palliative Care meeting due by:  2/22  Jacquelin Hawking,  MD PGY-2, [facility] 01/22/2015, 7:29 AM

## 2015-01-22 NOTE — Progress Notes (Signed)
CRITICAL VALUE ALERT  Critical value received:  Na 163 Cl >130  Date of notification:  01/22/15  Time of notification:  1143  Critical value read back: yes  Nurse who received alert:  Hermelinda MedicusIrekia Erminie Foulks RN  MD notified (1st page):  Dr. Mal MistyNetty present on unit verbally notified  Time MD responded: 1143

## 2015-01-22 NOTE — Consult Note (Signed)
CARDIOLOGY CONSULT NOTE  Patient ID: Dave LevinsWilliam Cervantes MRN: [id number] DOB/AGE: 1951-08-12 64 y.o.  Admit date: 01/20/2015 Reason for Consultation: Elevated troponin  HPI: 64 yo with history of HTN and hypothyroidism was admitted on 2/15 with lethargy and fecal/urinary incontinence.  He was found to have glucose 1400 with DKA, AKI, and hyperkalemia.  Creatinine got as high as 6.12.  He was hydrated and treated with IV insulin.  Creatinine has gradually come down, it was 2.87 today.  His mental status is gradually recovering.  Today he is slowed but alert and oriented to person and place.  Initially, there was concern for left-sided weakness and CVA but this resolved and no CVA seen on MRI. Today's labs showed profound hypernatremia.   Cardiology was called because of elevated troponin.  This peaked at 7332.  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  Patient denies current or prior chest pain.  He denies current or prior dyspnea.   Review of systems complete and found to be negative unless listed above in HPI  Past Medical History: 1. HTN 2. Hypothyroidism 3. Type II diabetes: New diagnosis  FH: Patient denies FH CAD.  History   Social History  . Marital Status: Single    Spouse Name: N/A  . Number of Children: N/A  . Years of Education: N/A   Occupational History  . Not on file.   Social History Main Topics  . Smoking status: Nonsmoker  . Smokeless tobacco: Not on file  . Alcohol Use: Not on file  . Drug Use: Not on file  . Sexual Activity: Not on file   Other Topics Concern  . Not on file   Social History Narrative  . No narrative on file     Prescriptions prior to admission  Medication Sig Dispense Refill Last Dose  . aspirin 81 MG tablet Take 81 mg by mouth daily.   Unknown  . atenolol (TENORMIN) 50 MG tablet Take 50 mg by mouth daily.   Unknown  . furosemide (LASIX) 20 MG tablet Take 20 mg by mouth daily.   Unknown  .  levothyroxine (SYNTHROID, LEVOTHROID) 150 MCG tablet Take 150 mcg by mouth daily before breakfast.   Unknown  . lisinopril-hydrochlorothiazide (PRINZIDE,ZESTORETIC) 10-12.5 MG per tablet Take 1 tablet by mouth daily.   Unknown    Physical exam Blood pressure 124/86, pulse 99, temperature 98.3 F (36.8 C), temperature source Oral, resp. rate 19, height 6' (1.829 m), weight 233 lb 4 oz (105.8 kg), SpO2 97 %. General: NAD Neck: No JVD, no thyromegaly or thyroid nodule.  Lungs: Clear to auscultation bilaterally with normal respiratory effort. CV: Nondisplaced PMI.  Heart regular S1/S2, no S3/S4, no murmur.  No peripheral edema.  No carotid bruit.  Normal pedal pulses.  Abdomen: Soft, nontender, no hepatosplenomegaly, no distention.  Skin: Intact without lesions or rashes.  Neurologic: Alert but slowed and oriented x 2 (person/place).  Psych: Normal affect. Extremities: No clubbing or cyanosis.  HEENT: Normal.   Labs:   Lab Results  Component Value Date   WBC 11.6* 01/22/2015   HGB 14.4 01/22/2015   HCT 42.8 01/22/2015   MCV 96.2 01/22/2015   PLT PENDING 01/22/2015    Recent Labs Lab 01/22/15 0314 01/22/15 1016  NA 160* 163*  K 3.6 3.6  CL >130* >130*  CO2 23 19  BUN 67* 60*  CREATININE 3.03* 2.87*  CALCIUM 8.2* 7.9*  PROT 6.3  --  BILITOT 1.0  --   ALKPHOS 76  --   ALT 37  --   AST 125*  --   GLUCOSE 333* 341*   Lab Results  Component Value Date   CKTOTAL 349* 01/20/2015   TROPONINI 12.96* 01/22/2015    Radiology: - MRI head: No CVA  EKG: NSR, inferior MI (?old), anterior < 1 mm ST depression and T wave inversion  ASSESSMENT AND PLAN: 64 yo with history of HTN and hypothyroidism was admitted on 2/15 with lethargy and fecal/urinary incontinence.  He was found to have glucose 1400 with DKA, AKI, and hyperkalemia.  Troponin was elevated markedly on labs suggesting NSTEMI.  1. CAD: NSTEMI.  Troponin peaked at 32.  He has had no chest pain or dyspnea.  ECG suggests  old inferior MI and there is anterior ST depression and T wave inversion.  It is possible that MI could have triggered his DKA episode.  At this point, his MI is several days old and he is chest pain free.  Additionally, he had AKI with creatinine up to 6.1 and now still 2.87.   - Would defer invasive evaluation for now.  Follow creatinine.  If normalizes, he would likely benefit from angiography in the future but at this point, it is not urgent.   - Start ASA 81, heparin gtt for now (plts and hgb normal), atorvastatin 80 mg daily.  - BP stable, I will put him on metoprolol 25 mg every 8 hrs.  - I will get an echocardiogram.  2. DKA: Resolving.  New diagnosis of diabetes.  Ongoing management per CCM.  3. AKI: Likely due to DKA.  Slowly resolving with IV fluid and resolution of hyperglycemia.  4. Hypernatremia: Will need increased free water, per CCM.   Marca Ancona 01/22/2015 12:39 PM

## 2015-01-22 NOTE — Progress Notes (Signed)
Subjective: Patient has become more positive and interactive. Sodium and chloride have remained elevated. No adverse overnight events were reported.  Objective: Current vital signs: BP 122/87 mmHg  Pulse 97  Temp(Src) 98.3 F (36.8 C) (Oral)  Resp 23  Ht 6' (1.829 m)  Wt 105.8 kg (233 lb 4 oz)  BMI 31.63 kg/m2  SpO2 96%  Neurologic Exam: Patient was awake and verbally responsive. He was well-oriented to person and place but slightly disoriented to time. He was able to follow commands well. Pupils were equal and reacted normally to light. Extraocular movements were full and conjugate lateral gaze. No facial weakness was noted. Patient moved upper and lower extremities equally with grossly normal strength throughout. Deep tendon reflexes were absent throughout. Plantar responses were mute bilaterally.  MRI of the brain on 01/22/2015 showed signs of acute stroke. MRA showed no large vessel occlusion or severe stenosis. MRV could not be done patient is unable tolerate further MRI testing.  Medications: I have reviewed the patient's current medications.  Assessment/Plan: 64 year old man with status, admitted with DKA. He has had persistently elevated sodium and chloride. MRI showed no acute intracranial abnormalities. No changes on MRI of the brain of central venous sinus thrombosis. EEG showed nonspecific moderate encephalopathic changes. Patient's mental status has improved significantly over past 24 hours. He has no focal findings at this point.  Recommend no changes in current management. No additional neurodiagnostic studies are indicated, for now. We will continue to follow this patient with you.  C.R. Roseanne RenoStewart, MD [facility] [phone]  01/22/2015  11:17 AM

## 2015-01-22 NOTE — Progress Notes (Signed)
ANTICOAGULATION CONSULT NOTE - FOLLOW UP  Pharmacy Consult:  Heparin Indication: chest pain/ACS  No Known Allergies  Patient Measurements: Height: 6' (182.9 cm) Weight: 233 lb 4 oz (105.8 kg) IBW/kg (Calculated) : 77.6 Heparin Dosing Weight: 100 kg  Vital Signs: Temp: 98.9 F (37.2 C) (02/17 0417) Temp Source: Oral (02/17 0417) BP: 116/80 mmHg (02/17 0600) Pulse Rate: 102 (02/17 0700)  Labs:  Recent Labs  01/20/15 1223 01/20/15 1239 01/20/15 1249  01/21/15 0315 01/21/15 0530  01/21/15 1200 01/21/15 1521 01/21/15 2103 01/21/15 2215 01/22/15 0314 01/22/15 0431  HGB 16.1  --  18.4*  18.4*  --  15.3  --   --   --   --   --   --   --  14.4  HCT 52.7*  --  54.0*  54.0*  --  44.1  --   --   --   --   --   --   --  42.8  PLT 248  --   --   --  202  --   --   --   --   --   --   --  PENDING  HEPARINUNFRC  --   --   --   --   --   --   --   --   --   --  1.34*  --   --   CREATININE  --   --  5.00*  5.00*  < > 4.32* 4.12*  --  3.81*  --  3.57*  --   --   --   CKTOTAL  --  349*  --   --   --   --   --   --   --   --   --   --   --   TROPONINI  --   --   --   < > 28.37*  --   < >  --  25.20* 20.78*  --  15.45*  --   < > = values in this interval not displayed.  Estimated Creatinine Clearance: 26.6 mL/min (by C-G formula based on Cr of 3.57).     Assessment: [age] YOM with positive cardiac enzymes to continue on IV heparin for ACS.  Heparin level supra-therapeutic and trended up post rate decrease overnight.  Repeat heparin level is slightly lower but remains supra-therapeutic.  RN reported hematuria and bleeding in mouth.   Goal of Therapy:  Heparin level 0.3-0.7 units/ml Monitor platelets by anticoagulation protocol: Yes     Plan:  - Hold heparin for 6 hours per Dr. Vassie LollAlva (stopped at 1000).  Will f/u with bleeding resolution and resume at a lower rate. - Daily HL / CBC    Dave Cervantes, PharmD, BCPS Pager:  [phone] 01/22/2015, 11:18 AM

## 2015-01-22 NOTE — Progress Notes (Signed)
ANTICOAGULATION CONSULT NOTE - FOLLOW UP  Pharmacy Consult:  Heparin Indication: chest pain/ACS  No Known Allergies  Patient Measurements: Height: 6' (182.9 cm) Weight: 233 lb 4 oz (105.8 kg) IBW/kg (Calculated) : 77.6 Heparin Dosing Weight: 100 kg  Vital Signs: Temp: 98.6 F (37 C) (02/17 1947) Temp Source: Oral (02/17 1947) BP: 112/73 mmHg (02/17 2142) Pulse Rate: 79 (02/17 2142)  Labs:  Recent Labs  01/20/15 1223 01/20/15 1239 01/20/15 1249  01/21/15 0315  01/21/15 2215 01/22/15 0314 01/22/15 0431 01/22/15 0730 01/22/15 0800 01/22/15 0930 01/22/15 1016 01/22/15 1500 01/22/15 1700  HGB 16.1  --  18.4*  18.4*  --  15.3  --   --   --  14.4  --   --   --   --   --   --   HCT 52.7*  --  54.0*  54.0*  --  44.1  --   --   --  42.8  --   --   --   --   --   --   PLT 248  --   --   --  202  --   --   --  151  --   --   --   --   --   --   HEPARINUNFRC  --   --   --   --   --   --  1.34*  --   --  1.40*  --  1.22*  --   --   --   CREATININE  --   --  5.00*  5.00*  < > 4.32*  < >  --  3.03*  --   --   --   --  2.87*  --  2.75*  CKTOTAL  --  349*  --   --   --   --   --   --   --   --   --   --   --   --   --   TROPONINI  --   --   --   < > 28.37*  < >  --  15.45*  --   --  12.96*  --   --  11.37*  --   < > = values in this interval not displayed.  Estimated Creatinine Clearance: 34.6 mL/min (by C-G formula based on Cr of 2.75).   Assessment: 64 yo male with NSTEMI. Patient has been on heparin but this in now on hold due to hematuria, bleeding at the gums and nose. The nose bleed has resolved but per RN bleeding is still noted from the gums.    Goal of Therapy:  Heparin level 0.3-0.7 units/ml Monitor platelets by anticoagulation protocol: Yes     Plan:  -Will continue to hold heparin for now -Will follow heparin plans in am -CBC in am  Harland GermanAndrew Price [place], Pharm D 01/22/2015 10:06 PM

## 2015-01-22 NOTE — Progress Notes (Signed)
Inpatient Diabetes Program Recommendations  AACE/ADA: New Consensus Statement on Inpatient Glycemic Control (2013)  Target Ranges:  Prepandial:   less than 140 mg/dL      Peak postprandial:   less than 180 mg/dL (1-2 hours)      Critically ill patients:  140 - 180 mg/dL   Results for Dave Cervantes, Dave Cervantes (MRN [id number]) as of 01/22/2015 07:50  Ref. Range 01/21/2015 15:57 01/21/2015 20:10 01/22/2015 00:16 01/22/2015 03:19  Glucose-Capillary Latest Range: 70-99 mg/dL 829229 (H) 562391 (H) 130342 (H) 285 (H)   Results for Dave Cervantes, Dave Cervantes (MRN [id number]) as of 01/22/2015 07:50  Ref. Range 01/21/2015 21:03  Glucose Latest Range: 70-99 mg/dL 295442 (H)    Current orders for Inpatient glycemic control: Lantus 30 units Daily, Novolog 0-15 units Q4hrs  Inpatient Diabetes Program Recommendations  Insulin - IV drip/GlucoStabilizer: Noted patient was transitioned off the IV insulin at 1032 yesterday (2/16). Patient had glucose readings from 143 mg/dl to 284442 mg/dl since being transitioned off IV insulin. When the patient had 442 mg/dl at [id number] the patient was still acidodic with a CO2 of 17. There is not a BMET in since that time. When the patient had controlled glucose readings on the IV insulin, the patient was requiring between 8-9 units of insulin per hour. Please D/C current glycemic control order set (Phase 2 of DKA) and order ICU Phase 2 (IV insulin) glycemic control order set and restart the IV insulin.   HgbA1C: 13.7% on 01/20/15  Thanks,  Christena DeemShannon Lebaron Bautch RN, MSN, [facility] Inpatient Diabetes Coordinator Team Pager [phone]

## 2015-01-22 NOTE — Progress Notes (Signed)
Urinary output noted to be diminished during the last 2 hours. Pt restless and reaching for foley.  Foley was flushed but noted to be very difficult to flush. Bladder scan done and scan showed 600cc of urine. Current foley removed due being occluded.   Pt then voided on his own after foley was removed. Bladder scan repeated and noted 0cc of urine on scan. Condom catheter placed. Will continue to monitor.

## 2015-01-22 NOTE — Progress Notes (Signed)
PULMONARY / CRITICAL CARE MEDICINE   Name: Dave Cervantes MRN: [id number] DOB: 08/25/1951    ADMISSION DATE:  01/20/2015 CONSULTATION DATE:  01/20/2015  REFERRING MD :  EDP  CHIEF COMPLAINT:  AMS  INITIAL PRESENTATION: 63 year old male presented to [facility] ED 2/15 with AMS x 2 days. In ED was lethargic and hyperglycemic (Glu 1400) with positive urine ketones. PCCM called for admission.  STUDIES:    SIGNIFICANT EVENTS:   SUBJECTIVE:  Patient's blood sugars not well controlled after transition to 30u basal (morning fasting not obtained yet) CT, MRI, MRA not suggestive of acute process for encephalopathy Patient afebrile Alert, oriented x2 today, no chest pain, no shortness of breath Lipase down to 313 Foley replaced with condom cath due to obstructed catheter; hematuria noticed this AM s/p change   VITAL SIGNS: Temp:  [97.9 F (36.6 C)-98.9 F (37.2 C)] 98.3 F (36.8 C) (02/17 0802) Pulse Rate:  [88-106] 100 (02/17 0900) Resp:  [13-29] 25 (02/17 0900) BP: (90-134)/(64-85) 124/78 mmHg (02/17 0900) SpO2:  [91 %-100 %] 97 % (02/17 0900) Arterial Line BP: (86-158)/(60-89) 135/72 mmHg (02/17 0900) HEMODYNAMICS:   VENTILATOR SETTINGS:   INTAKE / OUTPUT:  Intake/Output Summary (Last 24 hours) at 01/22/15 1012 Last data filed at 01/22/15 0900  Gross per 24 hour  Intake 2986.8 ml  Output   1525 ml  Net 1461.8 ml    PHYSICAL EXAMINATION: General:  Male of normal body habitus in NAD Neuro:  Alert, CN intact, no focal deficit noticed, responds to commands HEENT:  [place]/AT, no JVD Cardiovascular:  RRR, no MRG Lungs:  Clear bilateral breath sounds Abdomen:  Soft, non-tender, non-distended Musculoskeletal:  No acute deformities. No edema Skin:  Grossly intact  LABS:  CBC  Recent Labs Lab 01/20/15 1223 01/20/15 1249 01/21/15 0315 01/22/15 0431  WBC 11.9*  --  12.1* 11.6*  HGB 16.1 18.4*  18.4* 15.3 14.4  HCT 52.7* 54.0*  54.0* 44.1 42.8  PLT 248  --  202 PENDING    Coag's No results for input(s): APTT, INR in the last 168 hours. BMET  Recent Labs Lab 01/21/15 1200 01/21/15 2103 01/22/15 0314  NA 161* 158* 160*  K 3.5 3.7 3.6  CL >130* 129* >130*  CO2 22 17* 23  BUN 83* 78* 67*  CREATININE 3.81* 3.57* 3.03*  GLUCOSE 151* 442* 333*   Electrolytes  Recent Labs Lab 01/20/15 1705  01/21/15 0315  01/21/15 1200 01/21/15 2103 01/22/15 0314  CALCIUM  --   < > 8.9  < > 8.7 8.1* 8.2*  MG 4.5*  --  4.0*  --   --   --  3.4*  PHOS 4.9*  --  <1.0*  --   --   --  2.4  < > = values in this interval not displayed. Sepsis Markers  Recent Labs Lab 01/20/15 1249 01/20/15 1705  LATICACIDVEN 5.00* 2.8*   ABG No results for input(s): PHART, PCO2ART, PO2ART in the last 168 hours. Liver Enzymes  Recent Labs Lab 01/20/15 1404 01/22/15 0314  AST 47* 125*  ALT 29 37  ALKPHOS 88 76  BILITOT 2.4* 1.0  ALBUMIN 3.2* 3.2*    Cardiac Enzymes  Recent Labs Lab 01/21/15 2103 01/22/15 0314 01/22/15 0800  TROPONINI 20.78* 15.45* 12.96*   Glucose  Recent Labs Lab 01/21/15 1346 01/21/15 1557 01/21/15 2010 01/22/15 0016 01/22/15 0319 01/22/15 0800  GLUCAP 144* 229* 391* 342* 285* 267*    Imaging Ct Head Wo Contrast  01/21/2015  CLINICAL DATA:  Initial evaluation of 64 year old male with altered mental status.  EXAM: CT HEAD WITHOUT CONTRAST  TECHNIQUE: Contiguous axial images were obtained from the base of the skull through the vertex without intravenous contrast.  COMPARISON:  No priors.  FINDINGS: Patchy and confluent areas of decreased attenuation are noted throughout the deep and periventricular white matter of the cerebral hemispheres bilaterally, compatible with chronic microvascular ischemic disease. No acute intracranial abnormalities. Specifically, no evidence of acute intracranial hemorrhage, no definite findings of acute/subacute cerebral ischemia, no mass, mass effect, hydrocephalus or abnormal intra or extra-axial fluid  collections. Visualized paranasal sinuses and mastoids are well pneumatized. No acute displaced skull fractures are identified.  IMPRESSION: 1. No acute intracranial abnormalities. 2. Mild chronic microvascular ischemic changes in the cerebral white matter.   Electronically Signed   By: Trudie Reed M.D.   On: 01/21/2015 11:59   Mr [name] Head Wo Contrast  01/22/2015   CLINICAL DATA:  Initial evaluation for altered mental status with left-sided weakness.  EXAM: MRI HEAD WITHOUT CONTRAST  MRA HEAD WITHOUT CONTRAST  TECHNIQUE: Multiplanar, multiecho pulse sequences of the brain and surrounding structures were obtained without intravenous contrast. Angiographic images of the head were obtained using MRA technique without contrast.  COMPARISON:  Prior CT from 01/21/2015.  FINDINGS: MRI HEAD FINDINGS  Study is degraded by motion artifact.  Mild diffuse prominence of the CSF containing spaces compatible with generalized cerebral atrophy. Mild confluent T2 S FLAIR hyperintensity within the periventricular white matter noted, nonspecific, but likely related to mild chronic small vessel ischemic changes.  No abnormal foci of restricted diffusion to suggest acute intracranial infarct. Gray-white matter differentiation maintained. No acute intracranial hemorrhage. Normal intravascular flow voids are present.  No mass lesion or midline shift. No mass effect. No hydrocephalus. No extra-axial fluid collection.  Craniocervical junction within normal limits. Pituitary gland normal. No acute abnormality seen about the orbits.  Mild mucoperiosteal thickening present within the ethmoidal air cells and maxillary sinuses. No air-fluid levels to suggest active sinus infection. No mastoid effusion.  Bone marrow signal intensity is normal. No acute abnormality seen within the scalp soft tissues.  MRA HEAD FINDINGS  ANTERIOR CIRCULATION:  The visualized distal cervical segments of the internal carotid arteries are widely patent with  antegrade flow. The petrous segments widely patent. Multi focal atheromatous irregularity present within the cavernous segments of the internal carotid arteries bilaterally, greater on the left. There is a short-segment moderate stenosis within the anterior genu of the cavernous left ICA (series 6, image 93). There is no more mild to moderate stenosis within the proximal aspect of the cavernous right ICA (series 6, image 83). Supraclinoid segments widely patent.  A1 segments, anterior communicating artery, and anterior cerebral arteries well opacified.  Right M1 segment well opacified without flow-limiting stenosis or occlusion. Right MCA bifurcation normal. Mild distal branch irregularity present within the right MCA branches.  The left M1 segment bifurcates early. There is a superimposed short-segment moderate stenosis within the left M1 segment just proximal to the bifurcation (series 6, image 102). Distal branch atherosclerotic irregularity seen distally within the left MCA branches.  POSTERIOR CIRCULATION:  Left vertebral artery is dominant and widely patent to the vertebrobasilar junction. There is a short segment mild stenosis within the distal left vertebral artery just proximal to the vertebrobasilar junction.  The diminutive right vertebral artery is markedly irregular with attenuated flow and multi focal moderate to severe stenoses. The distal right vertebral artery may be partially excluded  Prior to the vertebrobasilar junction.  The posterior inferior cerebral arteries are patent proximally.  Multi focal atheromatous irregularity present within the proximal basilar artery without significant stenosis. Superior cerebellar arteries are patent proximally. There appears to be atheromatous irregularity distally within the SCAs. P1 and P2 segments well opacified bilaterally. There is distal branch atheromatous irregularity within the PCAs bilaterally.  No aneurysm or vascular malformation.  IMPRESSION: MRI  HEAD IMPRESSION:  1. No acute intracranial infarct or other abnormality identified. 2. Generalized atrophy with mild chronic small vessel ischemic disease. 3. Please note that and MRV was not performed on this exam due to motion artifact and patient's inability to tolerate the length of the exam.  MRA HEAD IMPRESSION:  1. No proximal branch occlusion identified within the intracranial circulation. 2. Short-segment focal moderate stenoses within the cavernous ICAs and left M1 segment. 3. Heavy atheromatous disease within the right vertebral artery with secondary multi focal irregularity and attenuated flow related enhancement. 4. Widely patent left vertebral artery. 5. Mild to moderate atheromatous irregularity within the proximal basilar artery without flow-limiting stenosis. 6. Distal branch atheromatous irregularity within the MCA and PCA branches bilaterally.   Electronically Signed   By: Rise MuBenjamin  McClintock M.D.   On: 01/22/2015 04:47   Mr Brain Wo Contrast  01/22/2015   CLINICAL DATA:  Initial evaluation for altered mental status with left-sided weakness.  EXAM: MRI HEAD WITHOUT CONTRAST  MRA HEAD WITHOUT CONTRAST  TECHNIQUE: Multiplanar, multiecho pulse sequences of the brain and surrounding structures were obtained without intravenous contrast. Angiographic images of the head were obtained using MRA technique without contrast.  COMPARISON:  Prior CT from 01/21/2015.  FINDINGS: MRI HEAD FINDINGS  Study is degraded by motion artifact.  Mild diffuse prominence of the CSF containing spaces compatible with generalized cerebral atrophy. Mild confluent T2 S FLAIR hyperintensity within the periventricular white matter noted, nonspecific, but likely related to mild chronic small vessel ischemic changes.  No abnormal foci of restricted diffusion to suggest acute intracranial infarct. Gray-white matter differentiation maintained. No acute intracranial hemorrhage. Normal intravascular flow voids are present.  No mass  lesion or midline shift. No mass effect. No hydrocephalus. No extra-axial fluid collection.  Craniocervical junction within normal limits. Pituitary gland normal. No acute abnormality seen about the orbits.  Mild mucoperiosteal thickening present within the ethmoidal air cells and maxillary sinuses. No air-fluid levels to suggest active sinus infection. No mastoid effusion.  Bone marrow signal intensity is normal. No acute abnormality seen within the scalp soft tissues.  MRA HEAD FINDINGS  ANTERIOR CIRCULATION:  The visualized distal cervical segments of the internal carotid arteries are widely patent with antegrade flow. The petrous segments widely patent. Multi focal atheromatous irregularity present within the cavernous segments of the internal carotid arteries bilaterally, greater on the left. There is a short-segment moderate stenosis within the anterior genu of the cavernous left ICA (series 6, image 93). There is no more mild to moderate stenosis within the proximal aspect of the cavernous right ICA (series 6, image 83). Supraclinoid segments widely patent.  A1 segments, anterior communicating artery, and anterior cerebral arteries well opacified.  Right M1 segment well opacified without flow-limiting stenosis or occlusion. Right MCA bifurcation normal. Mild distal branch irregularity present within the right MCA branches.  The left M1 segment bifurcates early. There is a superimposed short-segment moderate stenosis within the left M1 segment just proximal to the bifurcation (series 6, image 102). Distal branch atherosclerotic irregularity seen distally within the left MCA branches.  POSTERIOR CIRCULATION:  Left vertebral artery is dominant and widely patent to the vertebrobasilar junction. There is a short segment mild stenosis within the distal left vertebral artery just proximal to the vertebrobasilar junction.  The diminutive right vertebral artery is markedly irregular with attenuated flow and multi  focal moderate to severe stenoses. The distal right vertebral artery may be partially excluded Prior to the vertebrobasilar junction.  The posterior inferior cerebral arteries are patent proximally.  Multi focal atheromatous irregularity present within the proximal basilar artery without significant stenosis. Superior cerebellar arteries are patent proximally. There appears to be atheromatous irregularity distally within the SCAs. P1 and P2 segments well opacified bilaterally. There is distal branch atheromatous irregularity within the PCAs bilaterally.  No aneurysm or vascular malformation.  IMPRESSION: MRI HEAD IMPRESSION:  1. No acute intracranial infarct or other abnormality identified. 2. Generalized atrophy with mild chronic small vessel ischemic disease. 3. Please note that and MRV was not performed on this exam due to motion artifact and patient's inability to tolerate the length of the exam.  MRA HEAD IMPRESSION:  1. No proximal branch occlusion identified within the intracranial circulation. 2. Short-segment focal moderate stenoses within the cavernous ICAs and left M1 segment. 3. Heavy atheromatous disease within the right vertebral artery with secondary multi focal irregularity and attenuated flow related enhancement. 4. Widely patent left vertebral artery. 5. Mild to moderate atheromatous irregularity within the proximal basilar artery without flow-limiting stenosis. 6. Distal branch atheromatous irregularity within the MCA and PCA branches bilaterally.   Electronically Signed   By: Rise Mu M.D.   On: 01/22/2015 04:47   Dg Chest Port 1 View  01/21/2015   CLINICAL DATA:  Respiratory failure  EXAM: PORTABLE CHEST - 1 VIEW  COMPARISON:  01/20/2015  FINDINGS: A single AP portable view of the chest demonstrates no focal airspace consolidation or alveolar edema. The lungs are grossly clear. There is no large effusion or pneumothorax. Cardiac and mediastinal contours appear unremarkable.  There  is no significant interval change.  IMPRESSION: No active cardiopulmonary disease   Electronically Signed   By: Ellery Plunk M.D.   On: 01/21/2015 06:02   Dg Chest Port 1 View  01/20/2015   CLINICAL DATA:  Respiratory failure and altered mental status.  EXAM: PORTABLE CHEST - 1 VIEW  COMPARISON:  None.  FINDINGS: The mediastinal contour is normal. The heart size is upper limits of normal. The aorta is tortuous. The lung volumes are low. There is mild elevation of right hemidiaphragm. There is no focal infiltrate, pulmonary edema, or pleural effusion. The visualized skeletal structures are unremarkable.  IMPRESSION: No active cardiopulmonary disease.   Electronically Signed   By: Sherian Rein M.D.   On: 01/20/2015 16:30     ASSESSMENT / PLAN:  NEUROLOGIC A:   Acute metabolic encephalopathy Focal neurologic signs, ?central lesion   P:   RASS goal: 0 UDS negative Neurology consult, appreciate recommendations EEG shows encephalopathy  CARDIOVASCULAR A:  H/o HTN NSTEMI  P:  Follow troponin Cardiology consult Heparin gtt Hold preadmission Amlodipine, HCTZ, lisinopril  RENAL A:   High AG metabolic acidosis - lactic, ketoacidosis Acute renal failure, baseline Cr of 1.5 in 07/2014 Hyperkalemia, resolved Hypokalemia  P:   K repletion Follow Bmet q 8 hours  GASTROINTESTINAL A:  Acute pancreatitis - improved  P:   NPO SUP: IV Protonix  PULMONARY A: No acute issues  P:   O2 to keep sats >92% Monitor  HEMATOLOGIC A:   Leukocytosis  P:  Follow CBC  INFECTIOUS A:  Leukocytosis; afebrile  P:   BCx2 2/15 >>> Urine 2/15 >>> Monitor  ENDOCRINE A:  Diabetic ketoacidosis DM, A1C of 13.7 Hypothyroidism  P:   Increase to Lantus 38 units daily, continue SSI IVF Restart home synthroid IV (Half dose as IV) CBG q4 hours    FAMILY  - Updates: No family at bedside  - Inter-disciplinary family meet or Palliative Care meeting due by:  2/22  Jacquelin Hawking, MD PGY-2, [facility] 01/22/2015, 10:12 AM

## 2015-01-23 ENCOUNTER — Encounter (HOSPITAL_COMMUNITY): Payer: Self-pay | Admitting: General Practice

## 2015-01-23 DIAGNOSIS — G9341 Metabolic encephalopathy: Secondary | ICD-10-CM

## 2015-01-23 DIAGNOSIS — I214 Non-ST elevation (NSTEMI) myocardial infarction: Secondary | ICD-10-CM

## 2015-01-23 DIAGNOSIS — E0810 Diabetes mellitus due to underlying condition with ketoacidosis without coma: Secondary | ICD-10-CM

## 2015-01-23 DIAGNOSIS — N179 Acute kidney failure, unspecified: Secondary | ICD-10-CM

## 2015-01-23 LAB — BASIC METABOLIC PANEL
ANION GAP: 7 (ref 5–15)
Anion gap: 8 (ref 5–15)
BUN: 51 mg/dL — ABNORMAL HIGH (ref 6–23)
BUN: 44 mg/dL — ABNORMAL HIGH (ref 6–23)
CALCIUM: 7.8 mg/dL — AB (ref 8.4–10.5)
CALCIUM: 7.5 mg/dL — AB (ref 8.4–10.5)
CO2: 23 mmol/L (ref 19–32)
CO2: 20 mmol/L (ref 19–32)
CREATININE: 2.58 mg/dL — AB (ref 0.50–1.35)
CREATININE: 2.19 mg/dL — AB (ref 0.50–1.35)
Chloride: 129 mmol/L — ABNORMAL HIGH (ref 96–112)
Chloride: 125 mmol/L — ABNORMAL HIGH (ref 96–112)
GFR calc Af Amer: 35 mL/min — ABNORMAL LOW (ref >90)
GFR calc non Af Amer: 25 mL/min — ABNORMAL LOW (ref >90)
GFR, EST AFRICAN AMERICAN: 29 mL/min — AB (ref >90)
GFR, EST NON AFRICAN AMERICAN: 30 mL/min — AB (ref >90)
GLUCOSE: 276 mg/dL — AB (ref 70–99)
Glucose, Bld: 257 mg/dL — ABNORMAL HIGH (ref 70–99)
POTASSIUM: 3.5 mmol/L (ref 3.5–5.1)
Potassium: 3.8 mmol/L (ref 3.5–5.1)
SODIUM: 160 mmol/L — AB (ref 135–145)
Sodium: 152 mmol/L — ABNORMAL HIGH (ref 135–145)

## 2015-01-23 LAB — CBC
HCT: 40.7 % (ref 39.0–52.0)
Hemoglobin: 13.4 g/dL (ref 13.0–17.0)
MCH: 32.4 pg (ref 26.0–34.0)
MCHC: 32.9 g/dL (ref 30.0–36.0)
MCV: 98.5 fL (ref 78.0–100.0)
PLATELETS: 122 10*3/uL — AB (ref 150–400)
RBC: 4.13 MIL/uL — ABNORMAL LOW (ref 4.22–5.81)
RDW: 15.2 % (ref 11.5–15.5)
WBC: 9 10*3/uL (ref 4.0–10.5)

## 2015-01-23 LAB — GLUCOSE, CAPILLARY
GLUCOSE-CAPILLARY: 228 mg/dL — AB (ref 70–99)
Glucose-Capillary: 223 mg/dL — ABNORMAL HIGH (ref 70–99)
Glucose-Capillary: 225 mg/dL — ABNORMAL HIGH (ref 70–99)
Glucose-Capillary: 286 mg/dL — ABNORMAL HIGH (ref 70–99)
Glucose-Capillary: 322 mg/dL — ABNORMAL HIGH (ref 70–99)
Glucose-Capillary: 375 mg/dL — ABNORMAL HIGH (ref 70–99)

## 2015-01-23 LAB — TROPONIN I
TROPONIN I: 6.39 ng/mL — AB (ref <0.031)
TROPONIN I: 4.94 ng/mL — AB (ref <0.031)
TROPONIN I: 3.19 ng/mL — AB (ref <0.031)
Troponin I: 8.8 ng/mL (ref <0.031)

## 2015-01-23 LAB — HEPARIN LEVEL (UNFRACTIONATED): Heparin Unfractionated: 0.47 IU/mL (ref 0.30–0.70)

## 2015-01-23 MED ORDER — LEVOTHYROXINE SODIUM 150 MCG PO TABS
150.0000 ug | ORAL_TABLET | Freq: Every day | ORAL | Status: DC
  Administered 2015-01-23 – 2015-01-29 (×7): 150 ug via ORAL
  Filled 2015-01-23 (×8): qty 1

## 2015-01-23 MED ORDER — INSULIN ASPART 100 UNIT/ML ~~LOC~~ SOLN
0.0000 [IU] | Freq: Three times a day (TID) | SUBCUTANEOUS | Status: DC
  Administered 2015-01-23: 15 [IU] via SUBCUTANEOUS
  Administered 2015-01-23: 7 [IU] via SUBCUTANEOUS
  Administered 2015-01-24: 15 [IU] via SUBCUTANEOUS
  Administered 2015-01-24: 11 [IU] via SUBCUTANEOUS
  Administered 2015-01-24 – 2015-01-25 (×2): 7 [IU] via SUBCUTANEOUS
  Administered 2015-01-25 (×2): 15 [IU] via SUBCUTANEOUS
  Administered 2015-01-26 (×2): 7 [IU] via SUBCUTANEOUS
  Administered 2015-01-26 – 2015-01-28 (×5): 4 [IU] via SUBCUTANEOUS
  Administered 2015-01-28 – 2015-01-29 (×3): 7 [IU] via SUBCUTANEOUS
  Administered 2015-01-29: 3 [IU] via SUBCUTANEOUS

## 2015-01-23 MED ORDER — INSULIN GLARGINE 100 UNIT/ML ~~LOC~~ SOLN
25.0000 [IU] | Freq: Two times a day (BID) | SUBCUTANEOUS | Status: DC
  Filled 2015-01-23 (×2): qty 0.25

## 2015-01-23 MED ORDER — INSULIN GLARGINE 100 UNIT/ML ~~LOC~~ SOLN
35.0000 [IU] | Freq: Two times a day (BID) | SUBCUTANEOUS | Status: DC
  Administered 2015-01-23 – 2015-01-24 (×3): 35 [IU] via SUBCUTANEOUS
  Filled 2015-01-23 (×5): qty 0.35

## 2015-01-23 MED ORDER — HEPARIN (PORCINE) IN NACL 100-0.45 UNIT/ML-% IJ SOLN
1000.0000 [IU]/h | INTRAMUSCULAR | Status: DC
  Administered 2015-01-23: 1000 [IU]/h via INTRAVENOUS
  Filled 2015-01-23 (×3): qty 250

## 2015-01-23 NOTE — Progress Notes (Signed)
Subjective: No complaints this AM still feels diffusely weak.  No events over night.   Objective: Current vital signs: BP 119/74 mmHg  Pulse 69  Temp(Src) 98.7 F (37.1 C) (Oral)  Resp 23  Ht 6' (1.829 m)  Wt 105.8 kg (233 lb 4 oz)  BMI 31.63 kg/m2  SpO2 97% Vital signs in last 24 hours: Temp:  [98.3 F (36.8 C)-98.7 F (37.1 C)] 98.7 F (37.1 C) (02/18 0412) Pulse Rate:  [67-103] 69 (02/18 0700) Resp:  [14-25] 23 (02/18 0700) BP: (105-144)/(65-87) 119/74 mmHg (02/18 0700) SpO2:  [93 %-98 %] 97 % (02/18 0700) Arterial Line BP: (131-159)/(72-87) 131/74 mmHg (02/17 1700)  Intake/Output from previous day: 02/17 0701 - 02/18 0700 In: 5310 [P.O.:1680; I.V.:3630] Out: 2250 [Urine:2250] Intake/Output this shift:   Nutritional status: Diet clear liquid  Neurologic Exam: General: Mental Status: Alert, oriented to hospital and 2016.  Speech fluent without evidence of aphasia.  attempts to the best of his ability to follow commands.  Cranial Nerves: II: Visual fields grossly normal, pupils equal, round, reactive to light and accommodation III,IV, VI: ptosis not present, extra-ocular motions intact bilaterally V,VII: smile symmetric, facial light touch sensation normal bilaterally VIII: hearing normal bilaterally IX,X: gag reflex present XI: bilateral shoulder shrug XII: midline tongue extension without atrophy or fasciculations  Motor: Diffusely weak throughout.  Able to lift bilateral arms 3 inches off the bed and bilateral legs 2 inches off the bed.  No lateralizing weakness noted.  Sensory: Pinprick and light touch intact throughout, bilaterally Deep Tendon Reflexes:  Depressed absent throughout  Plantars: Mute bilaterally     Lab Results: Basic Metabolic Panel:  Recent Labs Lab 01/20/15 1705  01/21/15 0315  01/21/15 2103 01/22/15 0314 01/22/15 1016 01/22/15 1700 01/23/15 0314  NA  --   < > 160*  < > 158* 160* 163* 159* 160*  K  --   < > 3.2*  < > 3.7  3.6 3.6 3.8 3.5  CL  --   < > 129*  < > 129* >130* >130* >130* 129*  CO2  --   < > 20  < > 17* 23 19 18* 23  GLUCOSE  --   < > 216*  < > 442* 333* 341* 327* 276*  BUN  --   < > 91*  < > 78* 67* 60* 59* 51*  CREATININE  --   < > 4.32*  < > 3.57* 3.03* 2.87* 2.75* 2.58*  CALCIUM  --   < > 8.9  < > 8.1* 8.2* 7.9* 7.8* 7.8*  MG 4.5*  --  4.0*  --   --  3.4*  --   --   --   PHOS 4.9*  --  <1.0*  --   --  2.4  --   --   --   < > = values in this interval not displayed.  Liver Function Tests:  Recent Labs Lab 01/20/15 1404 01/22/15 0314  AST 47* 125*  ALT 29 37  ALKPHOS 88 76  BILITOT 2.4* 1.0  PROT 6.4 6.3  ALBUMIN 3.2* 3.2*    Recent Labs Lab 01/20/15 1705 01/22/15 0314  LIPASE 2490* 313*  AMYLASE 1500*  --     Recent Labs Lab 01/21/15 0900  AMMONIA 25    CBC:  Recent Labs Lab 01/20/15 1223 01/20/15 1249 01/21/15 0315 01/22/15 0431 01/23/15 0244  WBC 11.9*  --  12.1* 11.6* 9.0  NEUTROABS 9.4*  --   --   --   --  HGB 16.1 18.4*  18.4* 15.3 14.4 13.4  HCT 52.7* 54.0*  54.0* 44.1 42.8 40.7  MCV 109.1*  --  95.7 96.2 98.5  PLT 248  --  202 151 122*    Cardiac Enzymes:  Recent Labs Lab 01/20/15 1239  01/22/15 0314 01/22/15 0800 01/22/15 1500 01/22/15 2055 01/23/15 0244  CKTOTAL 349*  --   --   --   --   --   --   TROPONINI  --   < > 15.45* 12.96* 11.37* 10.61* 8.80*  < > = values in this interval not displayed.  Lipid Panel:  Recent Labs Lab 01/21/15 1030  CHOL 251*  TRIG 418*  HDL 25*  CHOLHDL 10.0  VLDL UNABLE TO CALCULATE IF TRIGLYCERIDE OVER 400 mg/dL  LDLCALC UNABLE TO CALCULATE IF TRIGLYCERIDE OVER 400 mg/dL    CBG:  Recent Labs Lab 01/22/15 1551 01/22/15 1943 01/22/15 2356 01/23/15 0414 01/23/15 0753  GLUCAP 288* 255* 223* 286* 375*    Microbiology: Results for orders placed or performed during the hospital encounter of 01/20/15  Blood culture (routine x 2)     Status: None (Preliminary result)   Collection Time:  01/20/15 12:34 PM  Result Value Ref Range Status   Specimen Description BLOOD RIGHT ANTECUBITAL  Final   Special Requests BOTTLES DRAWN AEROBIC ONLY 4CC  Final   Culture   Final           BLOOD CULTURE RECEIVED NO GROWTH TO DATE CULTURE WILL BE HELD FOR 5 DAYS BEFORE ISSUING A FINAL NEGATIVE REPORT Performed at [facility]    Report Status PENDING  Incomplete  Urine culture     Status: None   Collection Time: 01/20/15 12:59 PM  Result Value Ref Range Status   Specimen Description URINE, CATHETERIZED  Final   Special Requests Normal  Final   Colony Count NO GROWTH Performed at [facility]   Final   Culture NO GROWTH Performed at [facility]   Final   Report Status 01/21/2015 FINAL  Final  Blood culture (routine x 2)     Status: None (Preliminary result)   Collection Time: 01/20/15  3:31 PM  Result Value Ref Range Status   Specimen Description BLOOD LEFT WRIST  Final   Special Requests BOTTLES DRAWN AEROBIC AND ANAEROBIC 3CC  Final   Culture   Final           BLOOD CULTURE RECEIVED NO GROWTH TO DATE CULTURE WILL BE HELD FOR 5 DAYS BEFORE ISSUING A FINAL NEGATIVE REPORT Performed at [facility]    Report Status PENDING  Incomplete  Culture, blood (routine x 2)     Status: None (Preliminary result)   Collection Time: 01/20/15  5:05 PM  Result Value Ref Range Status   Specimen Description BLOOD AEROBIC ONLY  Final   Special Requests RT HAND 3CC  Final   Culture   Final           BLOOD CULTURE RECEIVED NO GROWTH TO DATE CULTURE WILL BE HELD FOR 5 DAYS BEFORE ISSUING A FINAL NEGATIVE REPORT Performed at [facility]    Report Status PENDING  Incomplete  MRSA PCR Screening     Status: None   Collection Time: 01/20/15  5:10 PM  Result Value Ref Range Status   MRSA by PCR NEGATIVE NEGATIVE Final    Comment:        The GeneXpert MRSA Assay (FDA approved for NASAL specimens only), is one  component of a comprehensive MRSA  colonization surveillance program. It is not intended to diagnose MRSA infection nor to guide or monitor treatment for MRSA infections.     Coagulation Studies: No results for input(s): LABPROT, INR in the last 72 hours.  Imaging: Ct Head Wo Contrast  01/21/2015   CLINICAL DATA:  Initial evaluation of 64 year old male with altered mental status.  EXAM: CT HEAD WITHOUT CONTRAST  TECHNIQUE: Contiguous axial images were obtained from the base of the skull through the vertex without intravenous contrast.  COMPARISON:  No priors.  FINDINGS: Patchy and confluent areas of decreased attenuation are noted throughout the deep and periventricular white matter of the cerebral hemispheres bilaterally, compatible with chronic microvascular ischemic disease. No acute intracranial abnormalities. Specifically, no evidence of acute intracranial hemorrhage, no definite findings of acute/subacute cerebral ischemia, no mass, mass effect, hydrocephalus or abnormal intra or extra-axial fluid collections. Visualized paranasal sinuses and mastoids are well pneumatized. No acute displaced skull fractures are identified.  IMPRESSION: 1. No acute intracranial abnormalities. 2. Mild chronic microvascular ischemic changes in the cerebral white matter.   Electronically Signed   By: Trudie Reed M.D.   On: 01/21/2015 11:59   Mr [name] Head Wo Contrast  01/22/2015   CLINICAL DATA:  Initial evaluation for altered mental status with left-sided weakness.  EXAM: MRI HEAD WITHOUT CONTRAST  MRA HEAD WITHOUT CONTRAST  TECHNIQUE: Multiplanar, multiecho pulse sequences of the brain and surrounding structures were obtained without intravenous contrast. Angiographic images of the head were obtained using MRA technique without contrast.  COMPARISON:  Prior CT from 01/21/2015.  FINDINGS: MRI HEAD FINDINGS  Study is degraded by motion artifact.  Mild diffuse prominence of the CSF containing spaces compatible with generalized cerebral atrophy.  Mild confluent T2 S FLAIR hyperintensity within the periventricular white matter noted, nonspecific, but likely related to mild chronic small vessel ischemic changes.  No abnormal foci of restricted diffusion to suggest acute intracranial infarct. Gray-white matter differentiation maintained. No acute intracranial hemorrhage. Normal intravascular flow voids are present.  No mass lesion or midline shift. No mass effect. No hydrocephalus. No extra-axial fluid collection.  Craniocervical junction within normal limits. Pituitary gland normal. No acute abnormality seen about the orbits.  Mild mucoperiosteal thickening present within the ethmoidal air cells and maxillary sinuses. No air-fluid levels to suggest active sinus infection. No mastoid effusion.  Bone marrow signal intensity is normal. No acute abnormality seen within the scalp soft tissues.  MRA HEAD FINDINGS  ANTERIOR CIRCULATION:  The visualized distal cervical segments of the internal carotid arteries are widely patent with antegrade flow. The petrous segments widely patent. Multi focal atheromatous irregularity present within the cavernous segments of the internal carotid arteries bilaterally, greater on the left. There is a short-segment moderate stenosis within the anterior genu of the cavernous left ICA (series 6, image 93). There is no more mild to moderate stenosis within the proximal aspect of the cavernous right ICA (series 6, image 83). Supraclinoid segments widely patent.  A1 segments, anterior communicating artery, and anterior cerebral arteries well opacified.  Right M1 segment well opacified without flow-limiting stenosis or occlusion. Right MCA bifurcation normal. Mild distal branch irregularity present within the right MCA branches.  The left M1 segment bifurcates early. There is a superimposed short-segment moderate stenosis within the left M1 segment just proximal to the bifurcation (series 6, image 102). Distal branch atherosclerotic  irregularity seen distally within the left MCA branches.  POSTERIOR CIRCULATION:  Left vertebral artery is dominant and  widely patent to the vertebrobasilar junction. There is a short segment mild stenosis within the distal left vertebral artery just proximal to the vertebrobasilar junction.  The diminutive right vertebral artery is markedly irregular with attenuated flow and multi focal moderate to severe stenoses. The distal right vertebral artery may be partially excluded Prior to the vertebrobasilar junction.  The posterior inferior cerebral arteries are patent proximally.  Multi focal atheromatous irregularity present within the proximal basilar artery without significant stenosis. Superior cerebellar arteries are patent proximally. There appears to be atheromatous irregularity distally within the SCAs. P1 and P2 segments well opacified bilaterally. There is distal branch atheromatous irregularity within the PCAs bilaterally.  No aneurysm or vascular malformation.  IMPRESSION: MRI HEAD IMPRESSION:  1. No acute intracranial infarct or other abnormality identified. 2. Generalized atrophy with mild chronic small vessel ischemic disease. 3. Please note that and MRV was not performed on this exam due to motion artifact and patient's inability to tolerate the length of the exam.  MRA HEAD IMPRESSION:  1. No proximal branch occlusion identified within the intracranial circulation. 2. Short-segment focal moderate stenoses within the cavernous ICAs and left M1 segment. 3. Heavy atheromatous disease within the right vertebral artery with secondary multi focal irregularity and attenuated flow related enhancement. 4. Widely patent left vertebral artery. 5. Mild to moderate atheromatous irregularity within the proximal basilar artery without flow-limiting stenosis. 6. Distal branch atheromatous irregularity within the MCA and PCA branches bilaterally.   Electronically Signed   By: Rise MuBenjamin  McClintock M.D.   On: 01/22/2015  04:47   Mr Brain Wo Contrast  01/22/2015   CLINICAL DATA:  Initial evaluation for altered mental status with left-sided weakness.  EXAM: MRI HEAD WITHOUT CONTRAST  MRA HEAD WITHOUT CONTRAST  TECHNIQUE: Multiplanar, multiecho pulse sequences of the brain and surrounding structures were obtained without intravenous contrast. Angiographic images of the head were obtained using MRA technique without contrast.  COMPARISON:  Prior CT from 01/21/2015.  FINDINGS: MRI HEAD FINDINGS  Study is degraded by motion artifact.  Mild diffuse prominence of the CSF containing spaces compatible with generalized cerebral atrophy. Mild confluent T2 S FLAIR hyperintensity within the periventricular white matter noted, nonspecific, but likely related to mild chronic small vessel ischemic changes.  No abnormal foci of restricted diffusion to suggest acute intracranial infarct. Gray-white matter differentiation maintained. No acute intracranial hemorrhage. Normal intravascular flow voids are present.  No mass lesion or midline shift. No mass effect. No hydrocephalus. No extra-axial fluid collection.  Craniocervical junction within normal limits. Pituitary gland normal. No acute abnormality seen about the orbits.  Mild mucoperiosteal thickening present within the ethmoidal air cells and maxillary sinuses. No air-fluid levels to suggest active sinus infection. No mastoid effusion.  Bone marrow signal intensity is normal. No acute abnormality seen within the scalp soft tissues.  MRA HEAD FINDINGS  ANTERIOR CIRCULATION:  The visualized distal cervical segments of the internal carotid arteries are widely patent with antegrade flow. The petrous segments widely patent. Multi focal atheromatous irregularity present within the cavernous segments of the internal carotid arteries bilaterally, greater on the left. There is a short-segment moderate stenosis within the anterior genu of the cavernous left ICA (series 6, image 93). There is no more mild  to moderate stenosis within the proximal aspect of the cavernous right ICA (series 6, image 83). Supraclinoid segments widely patent.  A1 segments, anterior communicating artery, and anterior cerebral arteries well opacified.  Right M1 segment well opacified without flow-limiting stenosis or occlusion. Right  MCA bifurcation normal. Mild distal branch irregularity present within the right MCA branches.  The left M1 segment bifurcates early. There is a superimposed short-segment moderate stenosis within the left M1 segment just proximal to the bifurcation (series 6, image 102). Distal branch atherosclerotic irregularity seen distally within the left MCA branches.  POSTERIOR CIRCULATION:  Left vertebral artery is dominant and widely patent to the vertebrobasilar junction. There is a short segment mild stenosis within the distal left vertebral artery just proximal to the vertebrobasilar junction.  The diminutive right vertebral artery is markedly irregular with attenuated flow and multi focal moderate to severe stenoses. The distal right vertebral artery may be partially excluded Prior to the vertebrobasilar junction.  The posterior inferior cerebral arteries are patent proximally.  Multi focal atheromatous irregularity present within the proximal basilar artery without significant stenosis. Superior cerebellar arteries are patent proximally. There appears to be atheromatous irregularity distally within the SCAs. P1 and P2 segments well opacified bilaterally. There is distal branch atheromatous irregularity within the PCAs bilaterally.  No aneurysm or vascular malformation.  IMPRESSION: MRI HEAD IMPRESSION:  1. No acute intracranial infarct or other abnormality identified. 2. Generalized atrophy with mild chronic small vessel ischemic disease. 3. Please note that and MRV was not performed on this exam due to motion artifact and patient's inability to tolerate the length of the exam.  MRA HEAD IMPRESSION:  1. No proximal  branch occlusion identified within the intracranial circulation. 2. Short-segment focal moderate stenoses within the cavernous ICAs and left M1 segment. 3. Heavy atheromatous disease within the right vertebral artery with secondary multi focal irregularity and attenuated flow related enhancement. 4. Widely patent left vertebral artery. 5. Mild to moderate atheromatous irregularity within the proximal basilar artery without flow-limiting stenosis. 6. Distal branch atheromatous irregularity within the MCA and PCA branches bilaterally.   Electronically Signed   By: Rise Mu M.D.   On: 01/22/2015 04:47    Medications:  Scheduled: . antiseptic oral rinse  7 mL Mouth Rinse q12n4p  . aspirin  81 mg Oral Daily  . atorvastatin  80 mg Oral q1800  . chlorhexidine  15 mL Mouth Rinse BID  . insulin aspart  0-15 Units Subcutaneous 6 times per day  . insulin glargine  20 Units Subcutaneous BID  . levothyroxine  150 mcg Oral QAC breakfast  . metoprolol tartrate  25 mg Oral 3 times per day  . pantoprazole (PROTONIX) IV  40 mg Intravenous Q24H    Assessment/Plan:  64 year old man with status, admitted with DKA. He has had persistently elevated sodium and chloride. MRI showed no acute intracranial abnormalities. No changes on MRI of the brain of central venous sinus thrombosis. EEG showed nonspecific moderate encephalopathic changes. Patient's mental status has improved significantly over past 24 hours. He has no focal findings or lateralizing weakness.  He is weak throughout but gives good effort on exam.   Na, Cl, BUN, Cr and Glucose remain elevated.    Recommend no changes in current management. No additional neurodiagnostic studies are indicated.  Neurology will S/O     Dia Jefferys PA-C [facility] [phone]  01/23/2015, 8:47 AM

## 2015-01-23 NOTE — Progress Notes (Signed)
ANTICOAGULATION CONSULT NOTE - Follow Up Consult  Pharmacy Consult for Heparin Indication: chest pain/ACS  No Known Allergies  Patient Measurements: Height: 6' (182.9 cm) Weight: 233 lb 4 oz (105.8 kg) IBW/kg (Calculated) : 77.6 Heparin Dosing Weight: 100 kg  Vital Signs: Temp: 97.5 F (36.4 C) (02/18 0848) Temp Source: Oral (02/18 0848) BP: 121/71 mmHg (02/18 1000) Pulse Rate: 78 (02/18 1000)  Labs:  Recent Labs  01/20/15 1239  01/21/15 0315  01/21/15 2215  01/22/15 0431 01/22/15 0730  01/22/15 0930 01/22/15 1016  01/22/15 1700 01/22/15 2055 01/23/15 0244 01/23/15 0314 01/23/15 0924  HGB  --   < > 15.3  --   --   --  14.4  --   --   --   --   --   --   --  13.4  --   --   HCT  --   < > 44.1  --   --   --  42.8  --   --   --   --   --   --   --  40.7  --   --   PLT  --   --  202  --   --   --  151  --   --   --   --   --   --   --  122*  --   --   HEPARINUNFRC  --   --   --   --  1.34*  --   --  1.40*  --  1.22*  --   --   --   --   --   --   --   CREATININE  --   < > 4.32*  < >  --   < >  --   --   --   --  2.87*  --  2.75*  --   --  2.58*  --   CKTOTAL 349*  --   --   --   --   --   --   --   --   --   --   --   --   --   --   --   --   TROPONINI  --   < > 28.37*  < >  --   < >  --   --   < >  --   --   < >  --  10.61* 8.80*  --  6.39*  < > = values in this interval not displayed.  Estimated Creatinine Clearance: 36.9 mL/min (by C-G formula based on Cr of 2.58).   Assessment: AMS x 2d 67 YOM with positive cardiac enzymes to continue on IV heparin for ACS.   AC: Heparin for ACS, Heparin stopped 2/17 for complications of nose and gum bleeding and hematuria. Plan to resume heparin 2/18.  Infectious Disease: afebrile, WBC 9 down. All cultures negative. No abx.  Cardiovascular: HTN -VSS. CK 349, Troponin remains elevated- on ASA.81, Lipitor80, metoprolol. Plan coronary angio when renal function allows.   Endocrinology: hypothyroid on IV Synthroid, TSH WNL. No  hx DM but admitted with DKA, A1c 13.7% - insulin gtt >> SSI + Lantus 35 BID increased for CBGs 223-375 (48 uts SSI yesterday)  Gastrointestinal / Nutrition: lipase 2490>313, amylase 1500, tbili 1 down, AST elevated   Nephrology: SCr down 2.58 (unsure of BL), Na/CL 160/129, iCa low at 1.08, Mag 3.4 - 1/2NS at 150 ml/hr,  good UOP 0.9  Neuro:  mental status improving. MRI negative.  Pulmonary: RA >> 2L [place]>RA  Hematology / Oncology: H/H WNL, plts 248 (baseline)>122 in 72h  PTA Medication Issues: ASA, atenolol, Lasix, Synthroid 150, lisinopril, HCTZ  Best Practices: heparin gtt, MC  Goal of Therapy:  Heparin level 0.3-0.7 units/ml Monitor platelets by anticoagulation protocol: Yes   Plan:  Resume IV heparin at previous rate of 1000 units/hr Check heparin level in 6 hrs.   Edita Weyenberg S. Merilynn Finlandobertson, PharmD, BCPS Clinical Staff Pharmacist Pager [phone]  Misty Stanleyobertson, Clifford Coudriet Stillinger 01/23/2015,10:41 AM

## 2015-01-23 NOTE — Evaluation (Addendum)
Physical Therapy Evaluation Patient Details Name: Maylon Sailors MRN: [id number] DOB: [birth year] Today's Date: 01/23/2015   History of Present Illness  64 yo with history of HTN and hypothyroidism was admitted on 2/15 with lethargy and fecal/urinary incontinence. He was found to have glucose 1400 with DKA, AKI, and hyperkalemia.Pt with hypernatremia and NSTEMI along with AMS  Clinical Impression  Pt states he goes by Celene Kras and is limited by cognition and confusion today. Pt slow to respond with increased time for all transfers and transitional movements with assist to sequence and problem solve. Once EOB but distracted internally by mother's health and unable to move past this concern despite education and cues until he spoke to brother and even then was highly questioning of pt's brother. Pt with varied assist and response to cues currently with assist to stand ranging from mod to +2 max assist even within session. Pt will benefit from acute therapy to maximize mobility, function, balance and gait to decrease burden of care and increase independence.    Follow Up Recommendations CIR;Supervision/Assistance - 24 hour    Equipment Recommendations  Rolling walker with 5" wheels    Recommendations for Other Services OT consult;Speech consult;Rehab consult     Precautions / Restrictions Precautions Precautions: Fall Restrictions Weight Bearing Restrictions: No      Mobility  Bed Mobility Overal bed mobility: Needs Assistance Bed Mobility: Supine to Sit     Supine to sit: Min assist;HOB elevated     General bed mobility comments: HOB 20 degrees with max cues and assist to initiate mobility with assist to bring legs off of bed and elevate trunk from surface  Transfers Overall transfer level: Needs assistance   Transfers: Sit to/from Stand Sit to Stand: Mod assist;Max assist;+2 physical assistance         General transfer comment: pt stood from elevated bed x 2 with first time  maintaining crouched position and second time able to achieve fully upright with mod assist, cues and assist to extend hips and trunk to pivot to chair. Once in chair attempted to stand again but pt much more difficult to stand and required 2 person assist with max cues and assist to stand and unable to achieve fully upright for placement of chair alarm  Ambulation/Gait Ambulation/Gait assistance:  (unable at this time)              Stairs            Wheelchair Mobility    Modified Rankin (Stroke Patients Only)       Balance Overall balance assessment: Needs assistance   Sitting balance-Leahy Scale: Good       Standing balance-Leahy Scale: Poor                               Pertinent Vitals/Pain Pain Assessment: No/denies pain  HR 67-90    Home Living Family/patient expects to be discharged to:: Private residence Living Arrangements: Other relatives Available Help at Discharge: Family;Available 24 hours/day Type of Home: House Home Access: Stairs to enter   [employer] of Steps: 2 Home Layout: One level Home Equipment: None      Prior Function Level of Independence: Independent               Hand Dominance        Extremity/Trunk Assessment   Upper Extremity Assessment: Generalized weakness           Lower Extremity  Assessment: Generalized weakness      Cervical / Trunk Assessment: Kyphotic (forward head)  Communication      Cognition Arousal/Alertness: Awake/alert Behavior During Therapy: Anxious Overall Cognitive Status: Impaired/Different from baseline Area of Impairment: Orientation;Attention;Memory;Following commands;Safety/judgement;Awareness;Problem solving Orientation Level: Disoriented to;Time;Situation Current Attention Level: Focused Memory: Decreased short-term memory Following Commands: Follows one step commands consistently;Follows one step commands with increased time Safety/Judgement:  Decreased awareness of safety;Decreased awareness of deficits   Problem Solving: Decreased initiation;Slow processing General Comments: pt very internally distracted by not being able to recall his mother's health. Pt remained EOB at least 20 min in order to call brother and verify mother's health before pt was able to move onto the next task. Despite cues, reassurance and assist.    General Comments      Exercises        Assessment/Plan    PT Assessment Patient needs continued PT services  PT Diagnosis Difficulty walking;Generalized weakness;Altered mental status   PT Problem List Decreased strength;Decreased coordination;Decreased activity tolerance;Decreased knowledge of use of DME;Decreased balance;Decreased safety awareness;Obesity;Decreased mobility;Decreased cognition  PT Treatment Interventions Gait training;DME instruction;Balance training;Neuromuscular re-education;Cognitive remediation;Stair training;Functional mobility training;Patient/family education;Therapeutic activities;Therapeutic exercise   PT Goals (Current goals can be found in the Care Plan section) Acute Rehab PT Goals Patient Stated Goal: "go home and see mama" PT Goal Formulation: With patient Time For Goal Achievement: 02/06/15 Potential to Achieve Goals: Fair    Frequency Min 3X/week   Barriers to discharge Decreased caregiver support      Co-evaluation               End of Session Equipment Utilized During Treatment: Gait belt Activity Tolerance: Patient limited by fatigue Patient left: in chair;with call bell/phone within reach;with chair alarm set;with nursing/sitter in room Nurse Communication: Mobility status;Precautions         Time: [id number] PT Time Calculation (min) (ACUTE ONLY): 39 min   Charges:   PT Evaluation $Initial PT Evaluation Tier I: 1 Procedure PT Treatments $Therapeutic Activity: 23-37 mins   PT G CodesDelorse Lek:        Tabor, Abdulhamid Olgin Beth 01/23/2015, 9:46 AM Delaney MeigsMaija  Tabor Ibn Stief, PT [phone]

## 2015-01-23 NOTE — Progress Notes (Signed)
CRITICAL VALUE ALERT  Critical value received: Tropinin 10.61  Date of notification:  01/23/15  Time of notification:  0000  Critical value read back: No  Nurse who received alert:  Hazel Samshristian Terrones RN   MD notified (1st page): Wenda LowJames Joyner  Time of first page: 0001  MD notified (2nd page):  Time of second page:  Responding MD: Wenda LowJames Joyner   Time MD responded:  [phone]

## 2015-01-23 NOTE — Progress Notes (Signed)
Rehab Admissions Coordinator Note:  Patient was screened by Cystal Shannahan L for appropriateness for an Inpatient Acute Rehab Consult.  At this time, we are recommending Inpatient Rehab consult.  Senta Kantor L 01/23/2015, 11:07 AM  I can be reached at [phone].

## 2015-01-23 NOTE — Progress Notes (Signed)
ANTICOAGULATION CONSULT NOTE - Follow Up Consult  Pharmacy Consult for Heparin Indication: chest pain/ACS  No Known Allergies  Patient Measurements: Height: 6' (182.9 cm) Weight: 233 lb 4 oz (105.8 kg) IBW/kg (Calculated) : 77.6 Heparin Dosing Weight: 100 kg  Vital Signs: Temp: 98.4 F (36.9 C) (02/18 1631) Temp Source: Oral (02/18 1631) BP: 137/82 mmHg (02/18 1800) Pulse Rate: 62 (02/18 1800)  Labs:  Recent Labs  01/21/15 0315  01/22/15 0431 01/22/15 0730  01/22/15 0930 01/22/15 1016  01/22/15 1700  01/23/15 0244 01/23/15 0314 01/23/15 0924 01/23/15 1519 01/23/15 2041  HGB 15.3  --  14.4  --   --   --   --   --   --   --  13.4  --   --   --   --   HCT 44.1  --  42.8  --   --   --   --   --   --   --  40.7  --   --   --   --   PLT 202  --  151  --   --   --   --   --   --   --  122*  --   --   --   --   HEPARINUNFRC  --   < >  --  1.40*  --  1.22*  --   --   --   --   --   --   --   --  0.47  CREATININE 4.32*  < >  --   --   --   --  2.87*  --  2.75*  --   --  2.58*  --   --   --   TROPONINI 28.37*  < >  --   --   < >  --   --   < >  --   < > 8.80*  --  6.39* 4.94*  --   < > = values in this interval not displayed.  Estimated Creatinine Clearance: 36.9 mL/min (by C-G formula based on Cr of 2.58).   Assessment: AMS x 2d 76 YOM with positive cardiac enzymes to continue on IV heparin for ACS.   AC: Heparin for ACS, Heparin stopped 2/17 for complications of nose and gum bleeding and hematuria. Plan to resume heparin 2/18. Initial HL is therapeutic at 0.47 on heparin 1000 units/h.  Infectious Disease: afebrile, WBC 9 down. All cultures negative. No abx.  Cardiovascular: HTN -VSS. CK 349, Troponin remains elevated- on ASA.81, Lipitor80, metoprolol. Plan coronary angio when renal function allows.   Endocrinology: hypothyroid on IV Synthroid, TSH WNL. No hx DM but admitted with DKA, A1c 13.7% - insulin gtt >> SSI + Lantus 35 BID increased for CBGs 223-375 (48 uts SSI  yesterday)  Gastrointestinal / Nutrition: lipase 2490>313, amylase 1500, tbili 1 down, AST elevated   Nephrology: SCr down 2.58 (unsure of BL), Na/CL 160/129, iCa low at 1.08, Mag 3.4 - 1/2NS at 150 ml/hr, good UOP 0.9  Neuro:  mental status improving. MRI negative.  Pulmonary: RA >> 2L [place]>RA  Hematology / Oncology: H/H WNL, plts 248 (baseline)>122 in 72h  PTA Medication Issues: ASA, atenolol, Lasix, Synthroid 150, lisinopril, HCTZ  Best Practices: heparin gtt, MC  Goal of Therapy:  Heparin level 0.3-0.7 units/ml Monitor platelets by anticoagulation protocol: Yes   Plan:  Continue heparin 1000 units/hr Daily HL/CBC Monitor s/sx of bleeding  Arlean Hopping. Newman Pies, PharmD Clinical  Pharmacist Pager [phone] Delle ReiningBall, Mimi Debellis M 01/23/2015,9:08 PM

## 2015-01-23 NOTE — Progress Notes (Signed)
Patient Name: Dave LevinsWilliam Fraga Date of Encounter: 01/23/2015    SUBJECTIVE: No CV complaints.  TELEMETRY:  NSR Filed Vitals:   01/23/15 0500 01/23/15 0600 01/23/15 0634 01/23/15 0700  BP: 121/72 117/73 117/73 119/74  Pulse: 72 73 76 69  Temp:      TempSrc:      Resp: 21 22  23   Height:      Weight:      SpO2: 96% 97%  97%    Intake/Output Summary (Last 24 hours) at 01/23/15 0841 Last data filed at 01/23/15 0700  Gross per 24 hour  Intake   5150 ml  Output   2150 ml  Net   3000 ml   LABS: Basic Metabolic Panel:  Recent Labs  16/09/9601/16/16 0315  01/22/15 0314  01/22/15 1700 01/23/15 0314  NA 160*  < > 160*  < > 159* 160*  K 3.2*  < > 3.6  < > 3.8 3.5  CL 129*  < > >130*  < > >130* 129*  CO2 20  < > 23  < > 18* 23  GLUCOSE 216*  < > 333*  < > 327* 276*  BUN 91*  < > 67*  < > 59* 51*  CREATININE 4.32*  < > 3.03*  < > 2.75* 2.58*  CALCIUM 8.9  < > 8.2*  < > 7.8* 7.8*  MG 4.0*  --  3.4*  --   --   --   PHOS <1.0*  --  2.4  --   --   --   < > = values in this interval not displayed. CBC:  Recent Labs  01/20/15 1223  01/22/15 0431 01/23/15 0244  WBC 11.9*  < > 11.6* 9.0  NEUTROABS 9.4*  --   --   --   HGB 16.1  < > 14.4 13.4  HCT 52.7*  < > 42.8 40.7  MCV 109.1*  < > 96.2 98.5  PLT 248  < > 151 122*  < > = values in this interval not displayed. Cardiac Enzymes:  Recent Labs  01/20/15 1239  01/22/15 1500 01/22/15 2055 01/23/15 0244  CKTOTAL 349*  --   --   --   --   TROPONINI  --   < > 11.37* 10.61* 8.80*  < > = values in this interval not displayed. BNP: Invalid input(s): POCBNP Hemoglobin A1C:  Recent Labs  01/20/15 1705  HGBA1C 13.7*   Fasting Lipid Panel:  Recent Labs  01/21/15 1030  CHOL 251*  HDL 25*  LDLCALC UNABLE TO CALCULATE IF TRIGLYCERIDE OVER 400 mg/dL  TRIG [id number]*  CHOLHDL 40.910.0   ECHOCARDIOGRAM: Surprisingly, LVEF is normal with no regional WMA.  ECG: Old IMI with anterior TWA  Radiology/Studies:  No new  data  Physical Exam: Blood pressure 119/74, pulse 69, temperature 98.7 F (37.1 C), temperature source Oral, resp. rate 23, height 6' (1.829 m), weight 233 lb 4 oz (105.8 kg), SpO2 97 %. Weight change:   Wt Readings from Last 3 Encounters:  01/20/15 233 lb 4 oz (105.8 kg)    No rub and 2/6 systolic murmur  ASSESSMENT:  1. NSTEMI, unknown age. 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 Echo shows no WMA and preserved EF. 2. DKA resolving  Plan:  1. RFM 2. Beta blocker and LA nitrates as tolerated by BP 3. Coronary angio when renal function allows 4. Will follow and arrange cath as appropriate.  Signed, Lesleigh NoeSMITH III,Sahar Ryback W 01/23/2015,  8:41 AM

## 2015-01-23 NOTE — Progress Notes (Signed)
Pt. Arrived to the unit via bed accompanied by nurse. Family at bedside. Will give report to oncoming nurse.

## 2015-01-23 NOTE — Progress Notes (Signed)
PULMONARY / CRITICAL CARE MEDICINE   Name: Dave Cervantes MRN: [id number] DOB: [birth year]    ADMISSION DATE:  01/20/2015 CONSULTATION DATE:  01/20/2015  REFERRING MD :  EDP  CHIEF COMPLAINT:  AMS  INITIAL PRESENTATION: 64 year old male presented to [facility] ED 2/15 with AMS x 2 days. In ED was lethargic and hyperglycemic (Glu 1400) with positive urine ketones. PCCM called for admission.  STUDIES:  CT: no acute hemorrhage, chronic ischemic changes MRI/MRA: chronic atherosclerosis EEG:   SIGNIFICANT EVENTS: 2/16: NSTEMI- elevated troponin with new inverted t-wave in anterior leads 2/17: DKA resolved, lantus started; Troponin trending down 2/18: AMS improved, but not resolved; Na of 160; transfer to tele  SUBJECTIVE:  No chest pain or shortness of breath Patient states he does not feel normal but cannot explain why He does not feel clouded  VITAL SIGNS: Temp:  [98.3 F (36.8 C)-98.7 F (37.1 C)] 98.7 F (37.1 C) (02/18 0412) Pulse Rate:  [67-103] 76 (02/18 0634) Resp:  [13-25] 22 (02/18 0600) BP: (105-144)/(65-87) 117/73 mmHg (02/18 0634) SpO2:  [93 %-98 %] 97 % (02/18 0600) Arterial Line BP: (112-159)/(72-87) 131/74 mmHg (02/17 1700) HEMODYNAMICS:   VENTILATOR SETTINGS:   INTAKE / OUTPUT:  Intake/Output Summary (Last 24 hours) at 01/23/15 0647 Last data filed at 01/23/15 0600  Gross per 24 hour  Intake   4360 ml  Output   2250 ml  Net   2110 ml    PHYSICAL EXAMINATION: General:  Male, in NAD Neuro:  Alert, CN intact, no focal deficit noticed, responds to commands, Oriented to person and place HEENT:  [place]/AT, no JVD Cardiovascular:  RRR, no MRG Lungs:  Clear bilateral breath sounds Abdomen:  Soft, non-tender, non-distended Musculoskeletal:  No acute deformities. No edema Skin:  Grossly intact  LABS:  CBC  Recent Labs Lab 01/21/15 0315 01/22/15 0431 01/23/15 0244  WBC 12.1* 11.6* 9.0  HGB 15.3 14.4 13.4  HCT 44.1 42.8 40.7  PLT 202 151 122*   Coag's No  results for input(s): APTT, INR in the last 168 hours. BMET  Recent Labs Lab 01/22/15 1016 01/22/15 1700 01/23/15 0314  NA 163* 159* 160*  K 3.6 3.8 3.5  CL >130* >130* 129*  CO2 19 18* 23  BUN 60* 59* 51*  CREATININE 2.87* 2.75* 2.58*  GLUCOSE 341* 327* 276*   Electrolytes  Recent Labs Lab 01/20/15 1705  01/21/15 0315  01/22/15 0314 01/22/15 1016 01/22/15 1700 01/23/15 0314  CALCIUM  --   < > 8.9  < > 8.2* 7.9* 7.8* 7.8*  MG 4.5*  --  4.0*  --  3.4*  --   --   --   PHOS 4.9*  --  <1.0*  --  2.4  --   --   --   < > = values in this interval not displayed. Sepsis Markers  Recent Labs Lab 01/20/15 1249 01/20/15 1705  LATICACIDVEN 5.00* 2.8*   ABG No results for input(s): PHART, PCO2ART, PO2ART in the last 168 hours. Liver Enzymes  Recent Labs Lab 01/20/15 1404 01/22/15 0314  AST 47* 125*  ALT 29 37  ALKPHOS 88 76  BILITOT 2.4* 1.0  ALBUMIN 3.2* 3.2*    Cardiac Enzymes  Recent Labs Lab 01/22/15 1500 01/22/15 2055 01/23/15 0244  TROPONINI 11.37* 10.61* 8.80*   Glucose  Recent Labs Lab 01/22/15 0800 01/22/15 1208 01/22/15 1551 01/22/15 1943 01/22/15 2356 01/23/15 0414  GLUCAP 267* 319* 288* 255* 223* 286*    Imaging Ct Head Wo  Contrast  01/21/2015   CLINICAL DATA:  Initial evaluation of 64 year old male with altered mental status.  EXAM: CT HEAD WITHOUT CONTRAST  TECHNIQUE: Contiguous axial images were obtained from the base of the skull through the vertex without intravenous contrast.  COMPARISON:  No priors.  FINDINGS: Patchy and confluent areas of decreased attenuation are noted throughout the deep and periventricular white matter of the cerebral hemispheres bilaterally, compatible with chronic microvascular ischemic disease. No acute intracranial abnormalities. Specifically, no evidence of acute intracranial hemorrhage, no definite findings of acute/subacute cerebral ischemia, no mass, mass effect, hydrocephalus or abnormal intra or  extra-axial fluid collections. Visualized paranasal sinuses and mastoids are well pneumatized. No acute displaced skull fractures are identified.  IMPRESSION: 1. No acute intracranial abnormalities. 2. Mild chronic microvascular ischemic changes in the cerebral white matter.   Electronically Signed   By: Trudie Reed M.D.   On: 01/21/2015 11:59   Mr [name] Head Wo Contrast  01/22/2015   CLINICAL DATA:  Initial evaluation for altered mental status with left-sided weakness.  EXAM: MRI HEAD WITHOUT CONTRAST  MRA HEAD WITHOUT CONTRAST  TECHNIQUE: Multiplanar, multiecho pulse sequences of the brain and surrounding structures were obtained without intravenous contrast. Angiographic images of the head were obtained using MRA technique without contrast.  COMPARISON:  Prior CT from 01/21/2015.  FINDINGS: MRI HEAD FINDINGS  Study is degraded by motion artifact.  Mild diffuse prominence of the CSF containing spaces compatible with generalized cerebral atrophy. Mild confluent T2 S FLAIR hyperintensity within the periventricular white matter noted, nonspecific, but likely related to mild chronic small vessel ischemic changes.  No abnormal foci of restricted diffusion to suggest acute intracranial infarct. Gray-white matter differentiation maintained. No acute intracranial hemorrhage. Normal intravascular flow voids are present.  No mass lesion or midline shift. No mass effect. No hydrocephalus. No extra-axial fluid collection.  Craniocervical junction within normal limits. Pituitary gland normal. No acute abnormality seen about the orbits.  Mild mucoperiosteal thickening present within the ethmoidal air cells and maxillary sinuses. No air-fluid levels to suggest active sinus infection. No mastoid effusion.  Bone marrow signal intensity is normal. No acute abnormality seen within the scalp soft tissues.  MRA HEAD FINDINGS  ANTERIOR CIRCULATION:  The visualized distal cervical segments of the internal carotid arteries are  widely patent with antegrade flow. The petrous segments widely patent. Multi focal atheromatous irregularity present within the cavernous segments of the internal carotid arteries bilaterally, greater on the left. There is a short-segment moderate stenosis within the anterior genu of the cavernous left ICA (series 6, image 93). There is no more mild to moderate stenosis within the proximal aspect of the cavernous right ICA (series 6, image 83). Supraclinoid segments widely patent.  A1 segments, anterior communicating artery, and anterior cerebral arteries well opacified.  Right M1 segment well opacified without flow-limiting stenosis or occlusion. Right MCA bifurcation normal. Mild distal branch irregularity present within the right MCA branches.  The left M1 segment bifurcates early. There is a superimposed short-segment moderate stenosis within the left M1 segment just proximal to the bifurcation (series 6, image 102). Distal branch atherosclerotic irregularity seen distally within the left MCA branches.  POSTERIOR CIRCULATION:  Left vertebral artery is dominant and widely patent to the vertebrobasilar junction. There is a short segment mild stenosis within the distal left vertebral artery just proximal to the vertebrobasilar junction.  The diminutive right vertebral artery is markedly irregular with attenuated flow and multi focal moderate to severe stenoses. The distal right vertebral  artery may be partially excluded Prior to the vertebrobasilar junction.  The posterior inferior cerebral arteries are patent proximally.  Multi focal atheromatous irregularity present within the proximal basilar artery without significant stenosis. Superior cerebellar arteries are patent proximally. There appears to be atheromatous irregularity distally within the SCAs. P1 and P2 segments well opacified bilaterally. There is distal branch atheromatous irregularity within the PCAs bilaterally.  No aneurysm or vascular malformation.   IMPRESSION: MRI HEAD IMPRESSION:  1. No acute intracranial infarct or other abnormality identified. 2. Generalized atrophy with mild chronic small vessel ischemic disease. 3. Please note that and MRV was not performed on this exam due to motion artifact and patient's inability to tolerate the length of the exam.  MRA HEAD IMPRESSION:  1. No proximal branch occlusion identified within the intracranial circulation. 2. Short-segment focal moderate stenoses within the cavernous ICAs and left M1 segment. 3. Heavy atheromatous disease within the right vertebral artery with secondary multi focal irregularity and attenuated flow related enhancement. 4. Widely patent left vertebral artery. 5. Mild to moderate atheromatous irregularity within the proximal basilar artery without flow-limiting stenosis. 6. Distal branch atheromatous irregularity within the MCA and PCA branches bilaterally.   Electronically Signed   By: Rise Mu M.D.   On: 01/22/2015 04:47   Mr Brain Wo Contrast  01/22/2015   CLINICAL DATA:  Initial evaluation for altered mental status with left-sided weakness.  EXAM: MRI HEAD WITHOUT CONTRAST  MRA HEAD WITHOUT CONTRAST  TECHNIQUE: Multiplanar, multiecho pulse sequences of the brain and surrounding structures were obtained without intravenous contrast. Angiographic images of the head were obtained using MRA technique without contrast.  COMPARISON:  Prior CT from 01/21/2015.  FINDINGS: MRI HEAD FINDINGS  Study is degraded by motion artifact.  Mild diffuse prominence of the CSF containing spaces compatible with generalized cerebral atrophy. Mild confluent T2 S FLAIR hyperintensity within the periventricular white matter noted, nonspecific, but likely related to mild chronic small vessel ischemic changes.  No abnormal foci of restricted diffusion to suggest acute intracranial infarct. Gray-white matter differentiation maintained. No acute intracranial hemorrhage. Normal intravascular flow voids are  present.  No mass lesion or midline shift. No mass effect. No hydrocephalus. No extra-axial fluid collection.  Craniocervical junction within normal limits. Pituitary gland normal. No acute abnormality seen about the orbits.  Mild mucoperiosteal thickening present within the ethmoidal air cells and maxillary sinuses. No air-fluid levels to suggest active sinus infection. No mastoid effusion.  Bone marrow signal intensity is normal. No acute abnormality seen within the scalp soft tissues.  MRA HEAD FINDINGS  ANTERIOR CIRCULATION:  The visualized distal cervical segments of the internal carotid arteries are widely patent with antegrade flow. The petrous segments widely patent. Multi focal atheromatous irregularity present within the cavernous segments of the internal carotid arteries bilaterally, greater on the left. There is a short-segment moderate stenosis within the anterior genu of the cavernous left ICA (series 6, image 93). There is no more mild to moderate stenosis within the proximal aspect of the cavernous right ICA (series 6, image 83). Supraclinoid segments widely patent.  A1 segments, anterior communicating artery, and anterior cerebral arteries well opacified.  Right M1 segment well opacified without flow-limiting stenosis or occlusion. Right MCA bifurcation normal. Mild distal branch irregularity present within the right MCA branches.  The left M1 segment bifurcates early. There is a superimposed short-segment moderate stenosis within the left M1 segment just proximal to the bifurcation (series 6, image 102). Distal branch atherosclerotic irregularity seen distally within  the left MCA branches.  POSTERIOR CIRCULATION:  Left vertebral artery is dominant and widely patent to the vertebrobasilar junction. There is a short segment mild stenosis within the distal left vertebral artery just proximal to the vertebrobasilar junction.  The diminutive right vertebral artery is markedly irregular with attenuated  flow and multi focal moderate to severe stenoses. The distal right vertebral artery may be partially excluded Prior to the vertebrobasilar junction.  The posterior inferior cerebral arteries are patent proximally.  Multi focal atheromatous irregularity present within the proximal basilar artery without significant stenosis. Superior cerebellar arteries are patent proximally. There appears to be atheromatous irregularity distally within the SCAs. P1 and P2 segments well opacified bilaterally. There is distal branch atheromatous irregularity within the PCAs bilaterally.  No aneurysm or vascular malformation.  IMPRESSION: MRI HEAD IMPRESSION:  1. No acute intracranial infarct or other abnormality identified. 2. Generalized atrophy with mild chronic small vessel ischemic disease. 3. Please note that and MRV was not performed on this exam due to motion artifact and patient's inability to tolerate the length of the exam.  MRA HEAD IMPRESSION:  1. No proximal branch occlusion identified within the intracranial circulation. 2. Short-segment focal moderate stenoses within the cavernous ICAs and left M1 segment. 3. Heavy atheromatous disease within the right vertebral artery with secondary multi focal irregularity and attenuated flow related enhancement. 4. Widely patent left vertebral artery. 5. Mild to moderate atheromatous irregularity within the proximal basilar artery without flow-limiting stenosis. 6. Distal branch atheromatous irregularity within the MCA and PCA branches bilaterally.   Electronically Signed   By: Rise MuBenjamin  McClintock M.D.   On: 01/22/2015 04:47     ASSESSMENT / PLAN:  NEUROLOGIC A:   Acute metabolic encephalopathy   P:   RASS goal: 0 UDS negative Neurology following EEG shows encephalopathy; no seizures MRI/MRA and CT unremarkable for etiology of altered mental status  CARDIOVASCULAR A:  H/o HTN NSTEMI  P:  Trend troponin Cardiology following Heparin gtt Hold preadmission  Amlodipine, HCTZ  RENAL A:   High AG metabolic acidosis - lactic, ketoacidosis Acute renal failure (appears prerenal). baseline Cr of 1.5 in 07/2014 Hyperkalemia, resolved Hypokalemia  P:   K repletion PRN Follow Bmet q 8 hours 1/2 NS @150ml /hr  GASTROINTESTINAL A:  Acute pancreatitis - improved  P:   Carb modified/heart healthy diet SUP: IV Protonix  PULMONARY A: No acute issues  P:   O2 to keep sats >92% Monitor  HEMATOLOGIC A:   Leukocytosis, resolved  P:  Follow CBC  INFECTIOUS A:  Leukocytosis; afebrile; resolved  P:   BCx2 2/15 >>> no growth Urine 2/15 >>> no growth Monitor  ENDOCRINE A:  Diabetic ketoacidosis DM, A1C of 13.7 Hypothyroidism  P:   Increase to Lantus 35 units BID, escalate to resistant SSI IVF Continue synthroid 150mg  PO CBG q4 hours    FAMILY  - Updates: No family at bedside  - Inter-disciplinary family meet or Palliative Care meeting due by:  2/22  Jacquelin Hawkingalph Nettey, MD PGY-2, [facility] 01/23/2015, 6:47 AM   He has improved significantly, DKA is resolved. Acute renal failure slowly improving. Hypernatremia is persistent-expect this to improve as he becomes euvolemic with half-normal saline. He will need diabetes teaching and aggressive physical therapy. He will need risk stratification for non-STEMI, once renal failure improves Transfer to floor and to triad  Dave Cervantes,Dave Ney V. MD

## 2015-01-24 ENCOUNTER — Encounter (HOSPITAL_COMMUNITY): Payer: Self-pay | Admitting: Physical Medicine and Rehabilitation

## 2015-01-24 DIAGNOSIS — R5381 Other malaise: Secondary | ICD-10-CM

## 2015-01-24 DIAGNOSIS — R4182 Altered mental status, unspecified: Secondary | ICD-10-CM

## 2015-01-24 DIAGNOSIS — R400 Somnolence: Secondary | ICD-10-CM

## 2015-01-24 DIAGNOSIS — G9341 Metabolic encephalopathy: Secondary | ICD-10-CM

## 2015-01-24 DIAGNOSIS — E1010 Type 1 diabetes mellitus with ketoacidosis without coma: Secondary | ICD-10-CM

## 2015-01-24 LAB — BASIC METABOLIC PANEL
ANION GAP: 8 (ref 5–15)
Anion gap: 9 (ref 5–15)
BUN: 39 mg/dL — AB (ref 6–23)
BUN: 36 mg/dL — ABNORMAL HIGH (ref 6–23)
CHLORIDE: 116 mmol/L — AB (ref 96–112)
CO2: 21 mmol/L (ref 19–32)
CO2: 22 mmol/L (ref 19–32)
Calcium: 7.3 mg/dL — ABNORMAL LOW (ref 8.4–10.5)
Calcium: 7.5 mg/dL — ABNORMAL LOW (ref 8.4–10.5)
Chloride: 121 mmol/L — ABNORMAL HIGH (ref 96–112)
Creatinine, Ser: 2.09 mg/dL — ABNORMAL HIGH (ref 0.50–1.35)
Creatinine, Ser: 1.95 mg/dL — ABNORMAL HIGH (ref 0.50–1.35)
GFR calc Af Amer: 40 mL/min — ABNORMAL LOW (ref >90)
GFR calc non Af Amer: 32 mL/min — ABNORMAL LOW (ref >90)
GFR, EST AFRICAN AMERICAN: 37 mL/min — AB (ref >90)
GFR, EST NON AFRICAN AMERICAN: 35 mL/min — AB (ref >90)
GLUCOSE: 233 mg/dL — AB (ref 70–99)
Glucose, Bld: 239 mg/dL — ABNORMAL HIGH (ref 70–99)
POTASSIUM: 3.4 mmol/L — AB (ref 3.5–5.1)
POTASSIUM: 3.2 mmol/L — AB (ref 3.5–5.1)
Sodium: 150 mmol/L — ABNORMAL HIGH (ref 135–145)
Sodium: 147 mmol/L — ABNORMAL HIGH (ref 135–145)

## 2015-01-24 LAB — TROPONIN I
TROPONIN I: 2.94 ng/mL — AB (ref <0.031)
Troponin I: 2.51 ng/mL (ref <0.031)
Troponin I: 2.18 ng/mL (ref <0.031)

## 2015-01-24 LAB — CBC
HCT: 37.1 % — ABNORMAL LOW (ref 39.0–52.0)
Hemoglobin: 11.9 g/dL — ABNORMAL LOW (ref 13.0–17.0)
MCH: 32.1 pg (ref 26.0–34.0)
MCHC: 32.1 g/dL (ref 30.0–36.0)
MCV: 100 fL (ref 78.0–100.0)
PLATELETS: 95 10*3/uL — AB (ref 150–400)
RBC: 3.71 MIL/uL — AB (ref 4.22–5.81)
RDW: 14.9 % (ref 11.5–15.5)
WBC: 8.1 10*3/uL (ref 4.0–10.5)

## 2015-01-24 LAB — GLUCOSE, CAPILLARY
GLUCOSE-CAPILLARY: 231 mg/dL — AB (ref 70–99)
GLUCOSE-CAPILLARY: 275 mg/dL — AB (ref 70–99)
Glucose-Capillary: 280 mg/dL — ABNORMAL HIGH (ref 70–99)
Glucose-Capillary: 344 mg/dL — ABNORMAL HIGH (ref 70–99)

## 2015-01-24 LAB — HEPARIN LEVEL (UNFRACTIONATED): Heparin Unfractionated: 0.33 IU/mL (ref 0.30–0.70)

## 2015-01-24 MED ORDER — INSULIN ASPART PROT & ASPART (70-30 MIX) 100 UNIT/ML ~~LOC~~ SUSP
35.0000 [IU] | Freq: Two times a day (BID) | SUBCUTANEOUS | Status: DC
  Administered 2015-01-25 – 2015-01-28 (×7): 35 [IU] via SUBCUTANEOUS
  Filled 2015-01-24: qty 10

## 2015-01-24 MED ORDER — INSULIN STARTER KIT- SYRINGES (ENGLISH)
1.0000 | Freq: Once | Status: AC
  Administered 2015-01-24: 1
  Filled 2015-01-24: qty 1

## 2015-01-24 MED ORDER — LIVING WELL WITH DIABETES BOOK
Freq: Once | Status: AC
  Administered 2015-01-24: 12:00:00
  Filled 2015-01-24: qty 1

## 2015-01-24 MED ORDER — POTASSIUM CHLORIDE CRYS ER 20 MEQ PO TBCR
40.0000 meq | EXTENDED_RELEASE_TABLET | Freq: Once | ORAL | Status: AC
  Administered 2015-01-24: 40 meq via ORAL
  Filled 2015-01-24: qty 2

## 2015-01-24 MED ORDER — INSULIN ASPART PROT & ASPART (70-30 MIX) 100 UNIT/ML ~~LOC~~ SUSP
20.0000 [IU] | Freq: Every day | SUBCUTANEOUS | Status: AC
  Administered 2015-01-24: 20 [IU] via SUBCUTANEOUS
  Filled 2015-01-24: qty 10

## 2015-01-24 MED ORDER — INSULIN ASPART 100 UNIT/ML ~~LOC~~ SOLN: 5.0000 [IU] | Freq: Three times a day (TID) | SUBCUTANEOUS | Status: DC

## 2015-01-24 NOTE — Discharge Summary (Signed)
[facility] Discharge Summary  Patient name: Dave LevinsWilliam Cervantes Medical record number: [id number] Date of birth: Nov 15, 1951 Age: 64 y.o. Gender: male Date of Admission: 01/20/2015  Date of Discharge: 01/29/15 Admitting Physician: Coralyn HellingVineet Sood, MD  Primary Care Provider: No primary care provider on file. Consultants: CCM, Cardiology, Neurology  Indication for Hospitalization: AMS, DKA  Discharge Diagnoses/Problem List:  DKA AKI Hyperkalemia Metabolic encephalopathy NSTEMI DVT  Disposition: Discharge to home with home health and [facility]  Discharge Condition: Stable  Discharge Exam:  Temp: [98 F (36.7 C)-99 F (37.2 C)] 99 F (37.2 C) (02/24 0625) Pulse Rate: [67-109] 88 (02/23 2136) Resp: [0-20] 20 (02/24 0625) BP: (127-163)/(71-91) 128/87 mmHg (02/24 0625) SpO2: [89 %-100 %] 100 % (02/24 0625) Weight: [271 lb 6.2 oz (123.1 kg)] 271 lb 6.2 oz (123.1 kg) (02/24 0500)   I/O last 3 completed shifts: In: 360 [P.O.:360] Out: 2200 [Urine:2200] Total I/O In: 150 [P.O.:150] Out: -  Physical Exam: General: awake, alert, NAD, sitting up in bed having breakfast HENT: [place]/AT, EOMI, poor dentition Cardiovascular: RRR, no m/r/g Respiratory: CTAB anteriorly, no increased WOB Abdomen: obese, soft, NT/ND, +BS Extremities: WWP, no cyanosis, clubbing or edema. Right hand with bandage in place Neuro: AAOx3, speech normal, follows commands, 5/5 LE and UE strength Skin: healing excoriations on b/l shins  Brief Hospital Course:  64 year old [facility] with AMS x 2 days. In ED was lethargic and hyperglycemic (Glu 1400) with positive urine ketones. PCCM called for admission.  Initial management per Critical care medicine team: DKA Patient was treated for DKA per the glucomander protocol.  He was bridged and transitioned to sub-q insulin.  CBGs were initially monitored hourly and then spaced as  appropriate.  He was also noted to have acute pancreatitis with elevated Lipase and amylase.  This resolved during hospitalization.   Once patient was stabilized by CCM, [facility].  He was transitioned to Novolog 70/30 insulin to prepare him for the NovoLIN 70/30 that he was to be discharged on.  Diabetic coordinator was consulted to assist in diabetic teaching and insulin dosage recommendations.  Patient ultimately discharged on NovoLIN 70/30 40 units BID.  AMS For patient's AMS, neurology was consulted as etiology was unclear.  EEG, MRI, MRA of brain was obtained.  EEG did not reveal any seizure like activity but was abnormal with findings consistent with a moderate encephalopathy, non specific as to cause. MRI/MRA showed no acute abnormalities and showed no changes of the brain of central venous sinus thrombosis.  In addition, blood culture and urine culture was obtained.  Both were negative for growth.  Patient's AMS gradually improved as his metabolic derangements improved.  AKI Patient with a peak Cr to 6.12 in ICU.  He was hydrated with IVFs as appropriate and metabolic derangements were treated.  His renal function gradually improved over his hospital course and IVFs were discontinued.  Upon discharge, patient's Cr was 1.79.  He had adequate UOP.  NSTEMI Patient also with elevated troponins peaking at 32.19.  He was found to have an NSTEMI.  Cardiology was consulted and patient was placed on a heparin gtt.  Their recommendation was that he be treated with a BB and have a coronary angiogram once his renal function improved. 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 The ostial  LAD lesion compromises a very large diagonal branch. There is also severe disease in the small diminutive circumflex.  CABG was recommended.  Cardiothoracic surgery was consulted to  evaluated patient's appropriateness for undergoing CABG.  Patient voiced reservations about pursuing CABG.  CTS agreed that patient could have this procedure done outpatient in 3-4 weeks, with the stipulation that he had close follow up with the [place].  Patient voiced good understanding of this and plans to see Dr Diona Foley within 1 week of discharge for referral to [facility] cardiology for further management.  Patient was evaluated by inpatient PT.  Inpatient rehabilitation was recommended but unfortunately, patient was found to be ineligible for this d/t finances.  CSW was consulted to assist in securing placement for short term care.  Patient was not agreeable to a SNF and therefore was discharged home with his sister and [facility].  Issues for Follow Up:   TSH, patient's Synthroid decreased in hospital to daily  CBGs; insulin tolerance/compliance  Lifestyle modification  Significant Procedures: cardiac catheterization   Significant Labs and Imaging:   Recent Labs Lab 01/26/15 0505 01/27/15 0848 01/28/15 0549  WBC 7.6 8.5 7.0  HGB 11.2* 12.4* 10.0*  HCT 33.9* 37.4* 29.7*  PLT 99* 146* 161    Recent Labs Lab 01/22/15 0314  01/24/15 1505 01/25/15 0543 01/26/15 0505 01/27/15 0848 01/28/15 0549  NA 160*  < > 147* 146* 142 139 141  K 3.6  < > 3.2* 4.5 3.3* 3.3* 3.5  CL >130*  < > 116* 115* 111 109 112  CO2 23  < > GLUCOSE 333*  < > 233* 180* 237* 194* 227*  BUN 67*  < > 36* 34* 31* 25* 22  CREATININE 3.03*  < > 1.95* 2.05* 1.91* 1.88* 1.88*  CALCIUM 8.2*  < > 7.5* 7.6* 8.2* 8.5 8.2*  MG 3.4*  --   --   --   --  2.3  --   PHOS 2.4  --   --   --   --   --   --   ALKPHOS 76  --   --   --   --   --   --   AST 125*  --   --   --   --   --   --   ALT 37  --   --   --   --   --   --   ALBUMIN 3.2*  --   --   --   --   --   --   < > = values in this interval not displayed.  Cardiac Panel (last 3 results) No results for input(s): CKTOTAL, CKMB, TROPONINI, RELINDX in the  last 72 hours. Lipid Panel     Component Value Date/Time   CHOL 251* 01/21/2015 1030   TRIG 418* 01/21/2015 1030   HDL 25* 01/21/2015 1030   CHOLHDL 10.0 01/21/2015 1030   VLDL UNABLE TO CALCULATE IF TRIGLYCERIDE OVER 400 mg/dL 16/09/9603 5409   LDLCALC UNABLE TO CALCULATE IF TRIGLYCERIDE OVER 400 mg/dL 81/19/1478 2956   ABG    Component Value Date/Time   HCO3 10.5* 01/20/2015 1251   TCO2 12 01/20/2015 1251   ACIDBASEDEF 18.0* 01/20/2015 1251   O2SAT 55.0 01/20/2015 1251   TSH: 2.144 Ammonia: 25 Lipase: 2130>865 Amylase: 1500 A1c: 13.7 Lactic acid: 5.0>2.8 Blood cultures: no growth Urine culture: no growth  2D echocardiogram: Study Conclusions - Left ventricle: The cavity  size was normal. There was mild concentric hypertrophy. Systolic function was vigorous. The estimated ejection fraction was in the range of 65% to 70%. There was no dynamic obstruction. Although no diagnostic regional wall motion abnormality was identified, this possibility cannot be completely excluded on the basis of this study. Doppler parameters are consistent with abnormal left ventricular relaxation (grade 1 diastolic dysfunction). - Mitral valve: Calcified annulus.  EEG: Impression: this is an abnormal EEG with findings consistent with a moderate encephalopathy, non specific as to cause. No electrographic seizures noted.  Clinical correlation is advised.   Cardiac cath results:  Severe multivessel disease with a 100% occluded RCA and tandem 80-90% lesions in the ostial/proximal and mid LAD. The ostial LAD lesion compromises a very large diagonal branch. There is also severe disease in the small diminutive circumflex.  Echocardiographic evidence of normal/preserved cardiac function. Mild-Moderately elevated LVEDP  No results found. Ct Head Wo Contrast  01/21/2015   CLINICAL DATA:  Initial evaluation of 64 year old male with altered mental status.  EXAM: CT HEAD WITHOUT  CONTRAST  TECHNIQUE: Contiguous axial images were obtained from the base of the skull through the vertex without intravenous contrast.  COMPARISON:  No priors.  FINDINGS: Patchy and confluent areas of decreased attenuation are noted throughout the deep and periventricular white matter of the cerebral hemispheres bilaterally, compatible with chronic microvascular ischemic disease. No acute intracranial abnormalities. Specifically, no evidence of acute intracranial hemorrhage, no definite findings of acute/subacute cerebral ischemia, no mass, mass effect, hydrocephalus or abnormal intra or extra-axial fluid collections. Visualized paranasal sinuses and mastoids are well pneumatized. No acute displaced skull fractures are identified.  IMPRESSION: 1. No acute intracranial abnormalities. 2. Mild chronic microvascular ischemic changes in the cerebral white matter.   Electronically Signed   By: Trudie Reed M.D.   On: 01/21/2015 11:59   Dg Chest Port 1 View  01/21/2015   CLINICAL DATA:  Respiratory failure  EXAM: PORTABLE CHEST - 1 VIEW  COMPARISON:  01/20/2015  FINDINGS: A single AP portable view of the chest demonstrates no focal airspace consolidation or alveolar edema. The lungs are grossly clear. There is no large effusion or pneumothorax. Cardiac and mediastinal contours appear unremarkable.  There is no significant interval change.  IMPRESSION: No active cardiopulmonary disease   Electronically Signed   By: Ellery Plunk M.D.   On: 01/21/2015 06:02   Dg Chest Port 1 View  01/20/2015   CLINICAL DATA:  Respiratory failure and altered mental status.  EXAM: PORTABLE CHEST - 1 VIEW  COMPARISON:  None.  FINDINGS: The mediastinal contour is normal. The heart size is upper limits of normal. The aorta is tortuous. The lung volumes are low. There is mild elevation of right hemidiaphragm. There is no focal infiltrate, pulmonary edema, or pleural effusion. The visualized skeletal structures are unremarkable.   IMPRESSION: No active cardiopulmonary disease.   Electronically Signed   By: Sherian Rein M.D.   On: 01/20/2015 16:30    Results/Tests Pending at Time of Discharge: none  Discharge Medications:    Medication List    ASK your doctor about these medications        aspirin 81 MG tablet  Take 81 mg by mouth daily.     atenolol 50 MG tablet  Commonly known as:  TENORMIN  Take 50 mg by mouth daily.     furosemide 20 MG tablet  Commonly known as:  LASIX  Take 20 mg by mouth daily.     levothyroxine 150  MCG tablet  Commonly known as:  SYNTHROID, LEVOTHROID  Take 150 mcg by mouth daily before breakfast.     lisinopril-hydrochlorothiazide 10-12.5 MG per tablet  Commonly known as:  PRINZIDE,ZESTORETIC  Take 1 tablet by mouth daily.        Discharge Instructions: Please refer to Patient Instructions section of EMR for full details.  Patient was counseled important signs and symptoms that should prompt return to medical care, changes in medications, dietary instructions, activity restrictions, and follow up appointments.   Follow-Up Appointments: Follow-up Information    Follow up with [facility].   Why:  HHRN/ HHPT   Contact information:   [address] [place] [address] [phone]       Follow up with [employer].   Why:  will mail rollingwalker to home address   Contact information:   [facility] Hulen Skains, DO 01/28/2015, 8:24 PM PGY-1, [facility]

## 2015-01-24 NOTE — Progress Notes (Signed)
ANTICOAGULATION CONSULT NOTE - Follow Up Consult  Pharmacy Consult for heparin Indication: chest pain/ACS  No Known Allergies  Patient Measurements: Height: 6' (182.9 cm) Weight: 233 lb 4 oz (105.8 kg) IBW/kg (Calculated) : 77.6 Heparin Dosing Weight:   Vital Signs: Temp: 98.7 F (37.1 C) (02/19 0555) Temp Source: Oral (02/19 0555) BP: 143/79 mmHg (02/19 0555) Pulse Rate: 69 (02/19 0555)  Labs:  Recent Labs  01/22/15 0431 01/22/15 0730  01/22/15 0930  01/22/15 1700  01/23/15 0244 01/23/15 0314  01/23/15 1519 01/23/15 2041 01/24/15 0332  HGB 14.4  --   --   --   --   --   --  13.4  --   --   --   --  11.9*  HCT 42.8  --   --   --   --   --   --  40.7  --   --   --   --  37.1*  PLT 151  --   --   --   --   --   --  122*  --   --   --   --  95*  HEPARINUNFRC  --  1.40*  --  1.22*  --   --   --   --   --   --   --  0.47  --   CREATININE  --   --   --   --   < > 2.75*  --   --  2.58*  --   --  2.19*  --   TROPONINI  --   --   < >  --   < >  --   < > 8.80*  --   < > 4.94* 3.19* 2.94*  < > = values in this interval not displayed.  Estimated Creatinine Clearance: 43.4 mL/min (by C-G formula based on Cr of 2.19).   Medications:  Scheduled:  . aspirin  81 mg Oral Daily  . atorvastatin  80 mg Oral q1800  . insulin aspart  0-20 Units Subcutaneous TID WC  . insulin glargine  35 Units Subcutaneous BID  . levothyroxine  150 mcg Oral QAC breakfast  . metoprolol tartrate  25 mg Oral 3 times per day   Infusions:  . sodium chloride 150 mL/hr at 01/24/15 0556  . heparin 1,000 Units/hr (01/23/15 1205)    Assessment: 64 yo male with ACS is currently on therapeutic heparin.  Heparin level is 0.33.  Hgb down to 11.9 and Plt down to 95 K (was 202 K few days ago).  Goal of Therapy:  Heparin level 0.3-0.7 units/ml Monitor platelets by anticoagulation protocol: Yes   Plan:  Continue heparin at 1000 units/hr for now. Contacted medical team about Plt  F/u on HIT??  Dave Cervantes,  Tsz-Yin 01/24/2015,8:40 AM

## 2015-01-24 NOTE — Progress Notes (Addendum)
Inpatient Diabetes Program Recommendations  AACE/ADA: New Consensus Statement on Inpatient Glycemic Control (2013)  Target Ranges:  Prepandial:   less than 140 mg/dL      Peak postprandial:   less than 180 mg/dL (1-2 hours)      Critically ill patients:  140 - 180 mg/dL   Results for JAMAUL, HEIST (MRN [id number]) as of 01/24/2015 08:57  Ref. Range 01/23/2015 07:53 01/23/2015 11:53 01/23/2015 16:23 01/23/2015 22:08 01/24/2015 07:48  Glucose-Capillary Latest Range: 70-99 mg/dL 375 (H) 322 (H) 228 (H) 225 (H) 280 (H)     Current orders for Inpatient glycemic control: Lantus 35 units BID, Novolog 0-20 TID with meals  Inpatient Diabetes Program Recommendations  Insulin - Basal: Patient's glucose is in the 200's. Fasting glucose this am was 288 mg/dl. Please consider increasing basal insulin to Lantus 40 units BID. Insulin - Meal Coverage: Please consider ordering Novolog 5 units TID with meals for meal coverage in addition to correction scale if patient is consuming at least 50% of meals.  MD: At time of discharge due to no insurance the patient may have to be converted to Novolin 70/30.  HgbA1C: 13.7% on 01/20/15  RN: Please start insulin syringe teaching and glucose checks at bedside. Patient educational videos ordered and listed, Living Well with Diabetes book ordered.  Consult Note: Patient more alert today appropriate for education. Spoke with pt about new diabetes diagnosis. Patient discussed with me about how many people in his family has diabetes. He mentions that he lost his sister to diabetes.  Discussed A1C results and explained what an A1C is, basic pathophysiology of DM Type 2, basic home care, importance of checking CBGs and maintaining good CBG control to prevent long-term and short-term complications. Reviewed signs and symptoms of hyperglycemia and hypoglycemia along with treatment for both. RNs to provide ongoing basic DM education at bedside with this patient and engage patient to  actively check blood glucose and administer insulin injections. Have ordered educational booklet, insulin starter kit, and DM videos. Educated patient on insulin vial and syringe use at home. Reviewed contents of insulin syringe starter kit. Reviewed all steps if insulin vial and syringe including the cleaning of administration area, drawing up air in syringe, clean top of vial, insert insulin syringe in vial, inject air into vial, drawing up insulin, giving injection, disposal of sharps, storage of unused insulin, disposal of insulin etc. Patient able to provide successful return demonstration. MD to give patient Rxs for Novolin 70/30 and syringes at discharge.  Thanks, Tama Headings RN, MSN, [facility] Inpatient Diabetes Coordinator Team Pager [phone]

## 2015-01-24 NOTE — Progress Notes (Signed)
Family Medicine Teaching Service Daily Progress Note Intern Pager: [phone]  Patient name: Dave Cervantes Medical record number: [id number] Date of birth: 07/28/1951 Age: 64 y.o. Gender: male  Primary Care Provider: No primary care provider on file. Consultants: Neurology, Cardiology, CIR Code Status: FULL  Pt Overview and Major Events to Date:  2/16: NSTEMI- elevated troponin with new inverted t-wave in anterior leads 2/17: DKA resolved, lantus started; Troponin trending down 2/18: AMS improved, but not resolved; Na of 160; transfer to tele 2/19: transferred to FPTS  Assessment and Plan: 64 year old male presented to [facility] ED 2/15 with AMS x 2 days. In ED was lethargic and hyperglycemic (Glu 1400) with positive urine ketones. PCCM called for admission.  Patient tranferred to FMTS on 01/24/15.   #NSTEMI: Troponins trending down 2.94 today. Echo shows no wall motion abnormalities and preserved EF. -c/s Cardiology: risk factor management, BB and LA nitrates as BP tolerates, Coronary angio when renal function allows, will cath when appropriate: signed off 2/19 until renal function improved. -IV heparin gtt, ASA 81  #DKA: CBG >1400 on admission.  A1c 13.7. CBGs 225-375/24 hours. Required 37 units novolog over last 24 hours.  70 units of Lantus -c/s diabetic coordinator, appreciate recs -Continue Resistant SSI (just changed on 2/18). Also added 5units TID with meals if patient consumes >50% of meal. -Continue Lantus 35 units BID.  #Acute metabolic encephalopathy: CT head 2/16 no acute abnormalities.  MRI brain no acute processes.  MRA: as outlined below.  Ammonia 25.  No obvious home medications that would cause AMS.  EEG with nonspecific moderate encephalopathic changes.  Mental status improving.   -Neurology signed off on 01/23/15  #HTN: BP 143/79 this am -Continue lopressor 25 TID -f/u cards recs  #ARF: Improving. Cr 2.19 yesterday evening. (Baseline 2015 Cr 1.5) -Twice daily BMETs, am  BMET pending.  Called phlebotomy, labs are still being obtained hospital wide. -1/2NS 150cc/hr  #Hypernatremia: to 162 on admission.  Na pending today. FeNa 0.49% on 2/15, suggests pre-renal. -Continue 1/2 NS fluids.  Normally would elect to give D5 in fluids, but in setting of recent DKA will continue to monitor and hydrate with 1/2NS.  #acute pancreatitis: Resolving. Lipase 2490 on admission, 313 on 2/17.  #hypothyroidism: On synthroid at home.  TSH 2.144  FEN/GI: 1/2NS @ 150cc/hr, Carb modified diet PPx: Heparin gtt  Disposition: Palliative care meeting on 2/22. CIR also evaluating for Inpatient rehab.  Subjective:  Patient reports that he is doing ok today.  He complains of dry mouth and lips and is asking for something for this.  He denies CP, SOB, abdominal pain, N/V, dizziness, headache.  He does report that his condom catheter feels like it is leaking.  Patient furthermore, reports that he was never diagnosed with DM before.  He lives at home with his sister and uses the [place] as his PCP.  This is where he has been getting his thyroid medications.    Objective: Temp:  [98.3 F (36.8 C)-98.7 F (37.1 C)] 98.7 F (37.1 C) (02/19 0555) Pulse Rate:  [60-78] 69 (02/19 0555) Resp:  [14-26] 15 (02/19 0555) BP: (115-143)/(71-82) 143/79 mmHg (02/19 0555) SpO2:  [94 %-100 %] 99 % (02/19 0555)   I/O last 3 completed shifts: In: 6876.7 [P.O.:1855; I.V.:5021.7] Out: 2430 [Urine:2430]    Physical Exam: General: awake, alert, NAD, phlebotomist and RN at bedside HENT: [place]/AT, EOMI, dry MM, lips peeling, poor dentition Cardiovascular: RRR, no m/r/g Respiratory: CTAB anteriorly, no increased WOB Abdomen: obese,  soft, NT/ND, +BS Extremities: WWP, no cyanosis, clubbing or edema. Neuro:  AAOx3, speech slowed, follows commands, 4/5 LE and UE strength  Laboratory:  Recent Labs Lab 01/22/15 0431 01/23/15 0244 01/24/15 0332  WBC 11.6* 9.0 8.1  HGB 14.4 13.4 11.9*  HCT 42.8 40.7  37.1*  PLT 151 122* 95*    Recent Labs Lab 01/20/15 1404  01/22/15 0314  01/22/15 1700 01/23/15 0314 01/23/15 2041  NA 141  < > 160*  < > 159* 160* 152*  K 6.4*  < > 3.6  < > 3.8 3.5 3.8  CL 101  < > >130*  < > >130* 129* 125*  CO2 12*  < > 23  < > 18* 23 20  BUN 92*  < > 67*  < > 59* 51* 44*  CREATININE 6.12*  < > 3.03*  < > 2.75* 2.58* 2.19*  CALCIUM 8.6  < > 8.2*  < > 7.8* 7.8* 7.5*  PROT 6.4  --  6.3  --   --   --   --   BILITOT 2.4*  --  1.0  --   --   --   --   ALKPHOS 88  --  76  --   --   --   --   ALT 29  --  37  --   --   --   --   AST 47*  --  125*  --   --   --   --   GLUCOSE 1399*  < > 333*  < > 327* 276* 257*  < > = values in this interval not displayed.  Cardiac Panel (last 3 results)  Recent Labs  01/23/15 1519 01/23/15 2041 01/24/15 0332  TROPONINI 4.94* 3.19* 2.94*   CBG (last 3)   Recent Labs  01/23/15 1623 01/23/15 2208 01/24/15 0748  GLUCAP 228* 225* 280*    Imaging/Diagnostic Tests: Ct Head Wo Contrast  01/21/2015   CLINICAL DATA:  Initial evaluation of 64 year old male with altered mental status.  EXAM: CT HEAD WITHOUT CONTRAST  TECHNIQUE: Contiguous axial images were obtained from the base of the skull through the vertex without intravenous contrast.  COMPARISON:  No priors.  FINDINGS: Patchy and confluent areas of decreased attenuation are noted throughout the deep and periventricular white matter of the cerebral hemispheres bilaterally, compatible with chronic microvascular ischemic disease. No acute intracranial abnormalities. Specifically, no evidence of acute intracranial hemorrhage, no definite findings of acute/subacute cerebral ischemia, no mass, mass effect, hydrocephalus or abnormal intra or extra-axial fluid collections. Visualized paranasal sinuses and mastoids are well pneumatized. No acute displaced skull fractures are identified.  IMPRESSION: 1. No acute intracranial abnormalities. 2. Mild chronic microvascular ischemic changes in  the cerebral white matter.   Electronically Signed   By: Trudie Reedaniel  Entrikin M.D.   On: 01/21/2015 11:59   Dg Chest Port 1 View  01/21/2015   CLINICAL DATA:  Respiratory failure  EXAM: PORTABLE CHEST - 1 VIEW  COMPARISON:  01/20/2015  FINDINGS: A single AP portable view of the chest demonstrates no focal airspace consolidation or alveolar edema. The lungs are grossly clear. There is no large effusion or pneumothorax. Cardiac and mediastinal contours appear unremarkable.  There is no significant interval change.  IMPRESSION: No active cardiopulmonary disease   Electronically Signed   By: Ellery Plunkaniel R Mitchell M.D.   On: 01/21/2015 06:02   Dg Chest Port 1 View  01/20/2015   CLINICAL DATA:  Respiratory failure and altered mental status.  EXAM: PORTABLE CHEST - 1 VIEW  COMPARISON:  None.  FINDINGS: The mediastinal contour is normal. The heart size is upper limits of normal. The aorta is tortuous. The lung volumes are low. There is mild elevation of right hemidiaphragm. There is no focal infiltrate, pulmonary edema, or pleural effusion. The visualized skeletal structures are unremarkable.  IMPRESSION: No active cardiopulmonary disease.   Electronically Signed   By: Sherian Rein M.D.   On: 01/20/2015 16:30    MRA HEAD IMPRESSION:  1. No proximal branch occlusion identified within the intracranial circulation. 2. Short-segment focal moderate stenoses within the cavernous ICAs and left M1 segment. 3. Heavy atheromatous disease within the right vertebral artery with secondary multi focal irregularity and attenuated flow related enhancement. 4. Widely patent left vertebral artery. 5. Mild to moderate atheromatous irregularity within the proximal basilar artery without flow-limiting stenosis. 6. Distal branch atheromatous irregularity within the MCA and PCA branches bilaterally.  MRI brain 1. No acute intracranial infarct or other abnormality identified. 2. Generalized atrophy with mild chronic small  vessel ischemic disease. 3. Please note that and MRV was not performed on this exam due to motion artifact and patient's inability to tolerate the length of the exam.   Raliegh Ip, DO 01/24/2015, 8:56 AM PGY-1, [facility] Family Medicine FPTS Intern pager: [phone], text pages welcome

## 2015-01-24 NOTE — Plan of Care (Signed)
Problem: Food- and Nutrition-Related Knowledge Deficit (NB-1.1) Goal: Nutrition education Formal process to instruct or train a patient/client in a skill or to impart knowledge to help patients/clients voluntarily manage or modify food choices and eating behavior to maintain or improve health. Outcome: Adequate for Discharge  RD consulted for nutrition education regarding diabetes.     Lab Results  Component Value Date    HGBA1C 13.7* 01/20/2015    RD provided "Carbohydrate Counting for People with Diabetes" handout from the Academy of Nutrition and Dietetics. Discussed different food groups and their effects on blood sugar, emphasizing carbohydrate-containing foods. Provided list of carbohydrates and recommended serving sizes of common foods.  Discussed importance of controlled and consistent carbohydrate intake throughout the day. Provided examples of ways to balance meals/snacks and encouraged intake of high-fiber, whole grain complex carbohydrates. Teach back method used.  Expect good compliance.  Body mass index is 31.63 kg/(m^2). Pt meets criteria for obesity, class I based on current BMI.  Current diet order is Heart Healthy/ Carb Modified, patient is consuming approximately 75% of meals at this time. Labs and medications reviewed. No further nutrition interventions warranted at this time. RD contact information provided. If additional nutrition issues arise, please re-consult RD.  Emmanuell Kantz A. Mayford KnifeWilliams, RD, LDN, CDE Pager: [phone] After hours Pager: [phone]

## 2015-01-24 NOTE — Progress Notes (Signed)
Call received from MD regarding transition from Lantus to 70/30 regimen due to cost.  Recommended discontinuation of Lantus and start of 70/30 with supper-20 units x1 and then start Novolog 70/30 35 units bid tomorrow. This regimen will likely need titration.  Also at discharge, please make sure to order Novolin 70/30 due to cost.    Thanks, Beryl MeagerJenny Amana Bouska, RN, BC-ADM Inpatient Diabetes Coordinator Pager [phone]

## 2015-01-24 NOTE — Consult Note (Signed)
Physical Medicine and Rehabilitation Consult  Reason for Consult: Encephalopathy past DKA Referring Physician:  Dr. Deirdre Priest   HPI: Dave Cervantes is a 64 y.o. male with history of HTN, hypothyroidism who was admitted on 01/20/15 with reports of lethargy X 2 days and found unresponsive with bowel and bladder incontinence. Patient noted to be obtunded with DKA with blood sugar at 1400, acute renal failure, acute pancreatitis as well as leucocytosis.  He was started on IV insulin and as well as fluids for metabolic acidosis. Neurology consulted due to reports of decrease in LUE and BLE movements. 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 EEG with moderate encephalopathy due to non-specific cause.  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 No invasive work up at this time as patient without complaints of CP and angiography for work up in the future. 2D echo with EF 65-70% with no regional wall abnormality with grade on diastolic dysfunction and calcified mitral valve.   Patient mental status have greatly improved as medical issues have been stabilized. No lateralizing weakness noted and neurology has signed off.  Patient continues on IV heparin with plans for cardiac cath once renal status and physical/mental status improves. PT evaluation done yesterday and patient noted to have cognitive as well as physical deficits impacting mobility. MD/PT recommending CIR for progressive therapy.    Review of Systems  HENT: Negative for hearing loss.   Eyes: Negative for blurred vision and double vision.  Respiratory: Negative for cough, shortness of breath and  wheezing.   Cardiovascular: Negative for chest pain and palpitations.  Gastrointestinal: Positive for abdominal pain. Negative for nausea and vomiting.  Genitourinary: Negative for frequency.  Musculoskeletal: Negative for myalgias, back pain and neck pain.  Neurological: Positive for weakness. Negative for dizziness, tingling and headaches.  Psychiatric/Behavioral: Positive for memory loss.      Past Medical History  Diagnosis Date  . Hypertension   . Hypothyroidism   . Graves disease     History reviewed. No pertinent past surgical history.    Family History  Problem Relation Age of Onset  . Diabetes Mother   . Diabetes Sister   . Coronary artery disease Mother   . Dementia Father       Social History:  Lives with sister. Independent PTA. Retired from a [employer] a year ago. He reports that he quit smoking about 30 years ago. He does not have any smokeless tobacco history on file. He has not used alcohol in 6 years. His drug histories is not on file.    Allergies: No Known Allergies    Medications Prior to Admission  Medication Sig Dispense Refill  . aspirin 81 MG tablet Take 81 mg by mouth daily.    Marland Kitchen atenolol (TENORMIN) 50 MG tablet Take 50 mg by mouth daily.    . furosemide (LASIX) 20 MG tablet Take 20 mg by mouth daily.    Marland Kitchen levothyroxine (SYNTHROID, LEVOTHROID) 150 MCG tablet Take 150 mcg by mouth daily before breakfast.    . lisinopril-hydrochlorothiazide (PRINZIDE,ZESTORETIC) 10-12.5 MG per tablet Take 1 tablet by mouth daily.      Home: Home Living Family/patient expects  to be discharged to:: Private residence Living Arrangements: Other relatives Available Help at Discharge: Family, Available 24 hours/day Type of Home: House Home Access: Stairs to enter Secretary/administrator of Steps: 2 Home Layout: One level Home Equipment: None  Functional History: Prior Function Level of Independence: Independent Functional Status:  Mobility: Bed  Mobility Overal bed mobility: Needs Assistance Bed Mobility: Supine to Sit Supine to sit: Min assist, HOB elevated General bed mobility comments: HOB 20 degrees with max cues and assist to initiate mobility with assist to bring legs off of bed and elevate trunk from surface Transfers Overall transfer level: Needs assistance Transfers: Sit to/from Stand Sit to Stand: Mod assist, Max assist, +2 physical assistance General transfer comment: pt stood from elevated bed x 2 with first time maintaining crouched position and second time able to achieve fully upright with mod assist, cues and assist to extend hips and trunk to pivot to chair. Once in chair attempted to stand again but pt much more difficult to stand and required 2 person assist with max cues and assist to stand and unable to achieve fully upright for placement of chair alarm Ambulation/Gait Ambulation/Gait assistance:  (unable at this time)    ADL:    Cognition: Cognition Overall Cognitive Status: Impaired/Different from baseline Orientation Level: Oriented to person, Oriented to place, Oriented to time Cognition Arousal/Alertness: Awake/alert Behavior During Therapy: Anxious Overall Cognitive Status: Impaired/Different from baseline Area of Impairment: Orientation, Attention, Memory, Following commands, Safety/judgement, Awareness, Problem solving Orientation Level: Disoriented to, Time, Situation Current Attention Level: Focused Memory: Decreased short-term memory Following Commands: Follows one step commands consistently, Follows one step commands with increased time Safety/Judgement: Decreased awareness of safety, Decreased awareness of deficits Problem Solving: Decreased initiation, Slow processing General Comments: pt very internally distracted by not being able to recall his mother's health. Pt remained EOB at least 20 min in order to call brother and verify mother's health before pt was able to move onto the next task.  Despite cues, reassurance and assist.  Blood pressure 143/79, pulse 69, temperature 98.7 F (37.1 C), temperature source Oral, resp. rate 15, height 6' (1.829 m), weight 105.8 kg (233 lb 4 oz), SpO2 99 %. Physical Exam  Nursing note and vitals reviewed. Constitutional: He is oriented to person, place, and time. He appears well-developed and well-nourished.  Large frame, obese  HENT:  Head: Normocephalic and atraumatic.  Eyes: Conjunctivae are normal. Pupils are equal, round, and reactive to light.  exophthalmus   Neck: Normal range of motion. Neck supple.  Cardiovascular: Normal rate and regular rhythm.   Respiratory: Effort normal and breath sounds normal. No respiratory distress. He has no wheezes.  GI: Soft. Bowel sounds are normal. He exhibits no distension.  Musculoskeletal: He exhibits edema. He exhibits no tenderness.  Bilateral shins with flaky and healed denuded areas.   Neurological: He is alert and oriented to person, place, and time.  Able to state place and situation as "fall" but unable to recall any other details. Able to state age and DOB. Delayed tangential speech and has problems with recall. Question word finding deficits. Needed redirection initially but was able to follow 2 step commands. Delayed motor movements.  UE's grossly 2- to 2/5 proximally 4/5 grip/wrist. LE's 2/5 HF, KE and 3/5 ankles. ?distal sensory loss to LT  Skin: Skin is warm and dry.  Chronic vascular changes in both legs  Psychiatric: His affect is blunt and labile. His speech is delayed and tangential. He is slowed. Cognition and  memory are impaired. He exhibits abnormal recent memory and abnormal remote memory.    Results for orders placed or performed during the hospital encounter of 01/20/15 (from the past 24 hour(s))  Troponin I     Status: Abnormal   Collection Time: 01/23/15  9:24 AM  Result Value Ref Range   Troponin I 6.39 (HH) <0.031 ng/mL  Glucose, capillary     Status: Abnormal    Collection Time: 01/23/15 11:53 AM  Result Value Ref Range   Glucose-Capillary 322 (H) 70 - 99 mg/dL  Troponin I     Status: Abnormal   Collection Time: 01/23/15  3:19 PM  Result Value Ref Range   Troponin I 4.94 (HH) <0.031 ng/mL  Glucose, capillary     Status: Abnormal   Collection Time: 01/23/15  4:23 PM  Result Value Ref Range   Glucose-Capillary 228 (H) 70 - 99 mg/dL  Troponin I     Status: Abnormal   Collection Time: 01/23/15  8:41 PM  Result Value Ref Range   Troponin I 3.19 (HH) <0.031 ng/mL  Basic metabolic panel     Status: Abnormal   Collection Time: 01/23/15  8:41 PM  Result Value Ref Range   Sodium 152 (H) 135 - 145 mmol/L   Potassium 3.8 3.5 - 5.1 mmol/L   Chloride 125 (H) 96 - 112 mmol/L   CO2 20 19 - 32 mmol/L   Glucose, Bld 257 (H) 70 - 99 mg/dL   BUN 44 (H) 6 - 23 mg/dL   Creatinine, Ser 0.982.19 (H) 0.50 - 1.35 mg/dL   Calcium 7.5 (L) 8.4 - 10.5 mg/dL   GFR calc non Af Amer 30 (L) >90 mL/min   GFR calc Af Amer 35 (L) >90 mL/min   Anion gap 7 5 - 15  Heparin level (unfractionated)     Status: None   Collection Time: 01/23/15  8:41 PM  Result Value Ref Range   Heparin Unfractionated 0.47 0.30 - 0.70 IU/mL  Glucose, capillary     Status: Abnormal   Collection Time: 01/23/15 10:08 PM  Result Value Ref Range   Glucose-Capillary 225 (H) 70 - 99 mg/dL   Comment 1 Notify RN    Comment 2 Documented in Char   Troponin I     Status: Abnormal   Collection Time: 01/24/15  3:32 AM  Result Value Ref Range   Troponin I 2.94 (HH) <0.031 ng/mL  CBC     Status: Abnormal   Collection Time: 01/24/15  3:32 AM  Result Value Ref Range   WBC 8.1 4.0 - 10.5 K/uL   RBC 3.71 (L) 4.22 - 5.81 MIL/uL   Hemoglobin 11.9 (L) 13.0 - 17.0 g/dL   HCT 11.937.1 (L) 14.739.0 - 82.952.0 %   MCV 100.0 78.0 - 100.0 fL   MCH 32.1 26.0 - 34.0 pg   MCHC 32.1 30.0 - 36.0 g/dL   RDW 56.214.9 13.011.5 - 86.515.5 %   Platelets 95 (L) 150 - 400 K/uL  Glucose, capillary     Status: Abnormal   Collection Time: 01/24/15   7:48 AM  Result Value Ref Range   Glucose-Capillary 280 (H) 70 - 99 mg/dL   No results found.  Assessment/Plan: Diagnosis: Deconditioning/encephalopathy due to DKA and NSTEMI 1. Does the need for close, 24 hr/day medical supervision in concert with the patient's rehab needs make it unreasonable for this patient to be served in a less intensive setting? Yes 2. Co-Morbidities requiring supervision/potential complications: AKI, type 1 DM 3.  Due to bladder management, bowel management, safety, skin/wound care, disease management, medication administration, pain management and patient education, does the patient require 24 hr/day rehab nursing? Yes 4. Does the patient require coordinated care of a physician, rehab nurse, PT (1-2 hrs/day, 5 days/week), OT (1-2 hrs/day, 5 days/week) and SLP (1-2 hrs/day, 5 days/week) to address physical and functional deficits in the context of the above medical diagnosis(es)? Yes Addressing deficits in the following areas: balance, endurance, locomotion, strength, transferring, bowel/bladder control, bathing, dressing, feeding, grooming, toileting, cognition and psychosocial support 5. Can the patient actively participate in an intensive therapy program of at least 3 hrs of therapy per day at least 5 days per week? Yes 6. The potential for patient to make measurable gains while on inpatient rehab is excellent 7. Anticipated functional outcomes upon discharge from inpatient rehab are supervision and min assist  with PT, supervision and min assist with OT, supervision with SLP. 8. Estimated rehab length of stay to reach the above functional goals is: 10-17 days 9. Does the patient have adequate social supports and living environment to accommodate these discharge functional goals? Yes and Potentially 10. Anticipated D/C setting: Home 11. Anticipated post D/C treatments: HH therapy 12. Overall Rehab/Functional Prognosis: excellent  RECOMMENDATIONS: This patient's  condition is appropriate for continued rehabilitative care in the following setting: CIR Patient has agreed to participate in recommended program. Yes Note that insurance prior authorization may be required for reimbursement for recommended care.  Comment: Rehab Admissions Coordinator to follow up. Spoke with Dr. Katrinka Blazing who stated he was ok from a cardiac standpoint to move to inpatient rehab.   Thanks,  Ranelle Oyster, MD, [place] [facility]     01/24/2015

## 2015-01-24 NOTE — Progress Notes (Signed)
Rehab admissions - I met with pt this am in follow up to rehab MD consult to explain the possibility of inpatient rehab. Pt is listed as self pay in Epic with the possibility of VA coverage. I clarified this with pt who stated he does have VA coverage. Pt was able to report to me his social security number.  Unfortunately, our rehab is not in network with [facility] and pt could come to inpatient rehab as a private pay pt. Pt stated that he could not afford that and prefers to seek further rehab under his VA coverage at SNF.  I updated Hilda Blades, case Freight forwarder and Marshell Levan, Education officer, museum about this. I also updated Emelda Brothers and Margie Billet in financial/billing as to his correct social security number.  Per pt's preference for SNF, I will now sign off pt's case.   Thanks.  Nanetta Batty, PT Rehabilitation Admissions Coordinator [phone]

## 2015-01-24 NOTE — Progress Notes (Signed)
**  Interval Note**  Spoke to Dr Katrinka BlazingSmith regarding platelets of 95 and informed him that heparin gtt will be stopped.  He was ok with this.  In addition, he did not recommend that we increase patient's ASA but rather leave him on the low dose of 81mg .  Thank you for recommendations.  Also, spoke to brother on phone and updated him per Patient's request.  Brother states that patient is completely fit and independent at baseline and that family is agreeable to short term nursing facility if this is what is needed to get him back on his feet.  Explained that we are working with CSW to have SNF arranged.  Shameka Aggarwal M. Nadine CountsGottschalk, DO PGY-1, [facility]

## 2015-01-24 NOTE — Progress Notes (Signed)
No real change. Overall neuro/cognitive function is slow. Renal function not yet known. Renal function and progress with rehab will determine timing of cath. Please call us when renal function stable and physical/neuro status is stable. Will sign off for now.

## 2015-01-25 DIAGNOSIS — E1011 Type 1 diabetes mellitus with ketoacidosis with coma: Secondary | ICD-10-CM

## 2015-01-25 DIAGNOSIS — I8290 Acute embolism and thrombosis of unspecified vein: Secondary | ICD-10-CM | POA: Insufficient documentation

## 2015-01-25 LAB — CBC
HEMATOCRIT: 37.4 % — AB (ref 39.0–52.0)
Hemoglobin: 12.2 g/dL — ABNORMAL LOW (ref 13.0–17.0)
MCH: 32.1 pg (ref 26.0–34.0)
MCHC: 32.6 g/dL (ref 30.0–36.0)
MCV: 98.4 fL (ref 78.0–100.0)
PLATELETS: 85 10*3/uL — AB (ref 150–400)
RBC: 3.8 MIL/uL — ABNORMAL LOW (ref 4.22–5.81)
RDW: 14.1 % (ref 11.5–15.5)
WBC: 7.8 10*3/uL (ref 4.0–10.5)

## 2015-01-25 LAB — BASIC METABOLIC PANEL
ANION GAP: 6 (ref 5–15)
BUN: 34 mg/dL — ABNORMAL HIGH (ref 6–23)
CALCIUM: 7.6 mg/dL — AB (ref 8.4–10.5)
CHLORIDE: 115 mmol/L — AB (ref 96–112)
CO2: 25 mmol/L (ref 19–32)
Creatinine, Ser: 2.05 mg/dL — ABNORMAL HIGH (ref 0.50–1.35)
GFR, EST AFRICAN AMERICAN: 38 mL/min — AB (ref >90)
GFR, EST NON AFRICAN AMERICAN: 33 mL/min — AB (ref >90)
Glucose, Bld: 180 mg/dL — ABNORMAL HIGH (ref 70–99)
Potassium: 4.5 mmol/L (ref 3.5–5.1)
SODIUM: 146 mmol/L — AB (ref 135–145)

## 2015-01-25 LAB — GLUCOSE, CAPILLARY
GLUCOSE-CAPILLARY: 307 mg/dL — AB (ref 70–99)
Glucose-Capillary: 220 mg/dL — ABNORMAL HIGH (ref 70–99)
Glucose-Capillary: 256 mg/dL — ABNORMAL HIGH (ref 70–99)
Glucose-Capillary: 194 mg/dL — ABNORMAL HIGH (ref 70–99)

## 2015-01-25 LAB — TROPONIN I
TROPONIN I: 0.68 ng/mL — AB (ref <0.031)
Troponin I: 1.27 ng/mL (ref <0.031)
Troponin I: 0.86 ng/mL (ref <0.031)

## 2015-01-25 MED ORDER — SENNA 8.6 MG PO TABS
1.0000 | ORAL_TABLET | Freq: Every day | ORAL | Status: DC
  Administered 2015-01-25 – 2015-01-28 (×3): 8.6 mg via ORAL
  Filled 2015-01-25 (×5): qty 1

## 2015-01-25 NOTE — Progress Notes (Signed)
Family Medicine Teaching Service Daily Progress Note Intern Pager: [phone]  Patient name: Dave LevinsWilliam Cervantes Medical record number: [id number] Date of birth: 1951/11/28 Age: 64 y.o. Gender: male  Primary Care Provider: No primary care provider on file. Consultants: Neurology, Cardiology Code Status: FULL  Pt Overview and Major Events to Date:  2/16: NSTEMI- elevated troponin with new inverted t-wave in anterior leads 2/17: DKA resolved, lantus started; Troponin trending down 2/18: AMS improved, but not resolved; Na of 160; transfer to tele 2/19: transferred to FPTS, heparin gtt stopped  Assessment and Plan: 64 year old [facility] with AMS x 2 days. In ED was lethargic and hyperglycemic (Glu 1400) with positive urine ketones. PCCM called for admission.  Patient tranferred to FMTS on 01/24/15.   #NSTEMI: Troponins trending down 2.94>1.27 today. Echo shows no wall motion abnormalities and preserved EF. -c/s Cardiology: risk factor management, BB and LA nitrates as BP tolerates, Coronary angio when renal function allows, will cath when appropriate: signed off 2/19 until renal function improved. -Heparin gtt stopped 2/19 because of low platelets <95 -ASA 81  #Thrombocytopenia:  95>85 this morning.  -Heparin gtt dc'd 2/19 -continue to monitor with CBC daily  #DKA: CBG >1400 on admission.  A1c 13.7. CBGs 180-344/24 hours.  -c/s diabetic coordinator, appreciate recs -SSI/Lantus changed to what patient will likely go home on in setting of being uninsured: Novolog 70/30- 20u last evening.  To start Novolog 70/30 35 u BID today.  #Acute metabolic encephalopathy: CT head 2/16 no acute abnormalities.  MRI brain no acute processes.  MRA: as outlined below.  Ammonia 25.  No obvious home medications that would cause AMS.  EEG with nonspecific moderate encephalopathic changes.  Mental status improving.   -Neurology signed off on 01/23/15  #HTN: BP 139/83 this am -Continue lopressor 25  TID -f/u cards recs  #ARF: Improving. Cr 2.05 today. Slight bump from yesterday.  (Baseline 2015 Cr 1.5) -Daily BMET -Encourage PO fluids  #Hypernatremia:  Na 146 today. FeNa 0.49% on 2/15, suggests pre-renal. -SLIV  #hypokalemia: K 3.2 yesterday evening.   -Daily BMET -Replete PRN  #acute pancreatitis: Resolving. Lipase 2490 on admission, 313 on 2/17.  #hypothyroidism: On synthroid 150mcg at home.  TSH 2.144 -recheck in 6 weeks  FEN/GI: SLIV, Carb modified diet PPx: SCDs  Disposition: SNF vs HH once medically stable.  Subjective: Patient reports that he is doing well this morning.  He is concerned because he cannot find his wallet.  Otherwise, he is feeling well.  He voices good understanding of plan.  He does state that he is unsure if he wants to go to a SNF at this point because he wants to spend time with his mother, who is in her 6990's and in an assisted living facility.  We discussed that I think it would be in his best interest to rehabilitate in a facility that can provide 24 hour supervision.  He states that his sister is readily available 24/d and that she would be able to help him.  He is going to discuss it with his family today.  Denies CP, SOB, N/V, abdominal pain, change in vision, headache.  Objective: Temp:  [98.2 F (36.8 C)-99 F (37.2 C)] 98.6 F (37 C) (02/20 0559) Pulse Rate:  [70-83] 73 (02/20 0559) Resp:  [12-22] 12 (02/20 0559) BP: (126-139)/(74-83) 139/83 mmHg (02/20 0559) SpO2:  [97 %-99 %] 99 % (02/20 0559)   I/O last 3 completed shifts: In: 3830 [P.O.:120; I.V.:3710] Out: 3400 [  Urine:3400]   Physical Exam: General: awake, alert, NAD, sitting up at bedside HENT: [place]/AT, EOMI, mildly dry MM, lips improved today, poor dentition Cardiovascular: RRR, no m/r/g Respiratory: CTAB anteriorly, no increased WOB Abdomen: obese, soft, NT/ND, +BS Extremities: WWP, no cyanosis, clubbing or edema. Neuro:  AAOx3, speech slowed (but improving), follows  commands, 4/5 LE and UE strength Skin: healing excoriations on b/l shins  Laboratory:  Recent Labs Lab 01/23/15 0244 01/24/15 0332 01/25/15 0540  WBC 9.0 8.1 7.8  HGB 13.4 11.9* 12.2*  HCT 40.7 37.1* 37.4*  PLT 122* 95* 85*    Recent Labs Lab 01/20/15 1404  01/22/15 0314  01/24/15 0818 01/24/15 1505 01/25/15 0543  NA 141  < > 160*  < > 150* 147* 146*  K 6.4*  < > 3.6  < > 3.4* 3.2* 4.5  CL 101  < > >130*  < > 121* 116* 115*  CO2 12*  < > 23  < > BUN 92*  < > 67*  < > 39* 36* 34*  CREATININE 6.12*  < > 3.03*  < > 2.09* 1.95* 2.05*  CALCIUM 8.6  < > 8.2*  < > 7.3* 7.5* 7.6*  PROT 6.4  --  6.3  --   --   --   --   BILITOT 2.4*  --  1.0  --   --   --   --   ALKPHOS 88  --  76  --   --   --   --   ALT 29  --  37  --   --   --   --   AST 47*  --  125*  --   --   --   --   GLUCOSE 1399*  < > 333*  < > 239* 233* 180*  < > = values in this interval not displayed.  Cardiac Panel (last 3 results)  Recent Labs  01/24/15 0818 01/24/15 1505 01/25/15 0543  TROPONINI 2.51* 2.18* 1.27*   CBG (last 3)   Recent Labs  01/24/15 1207 01/24/15 1750 01/24/15 2148  GLUCAP 344* 231* 275*    Imaging/Diagnostic Tests: Ct Head Wo Contrast  01/21/2015   CLINICAL DATA:  Initial evaluation of 64 year old male with altered mental status.  EXAM: CT HEAD WITHOUT CONTRAST  TECHNIQUE: Contiguous axial images were obtained from the base of the skull through the vertex without intravenous contrast.  COMPARISON:  No priors.  FINDINGS: Patchy and confluent areas of decreased attenuation are noted throughout the deep and periventricular white matter of the cerebral hemispheres bilaterally, compatible with chronic microvascular ischemic disease. No acute intracranial abnormalities. Specifically, no evidence of acute intracranial hemorrhage, no definite findings of acute/subacute cerebral ischemia, no mass, mass effect, hydrocephalus or abnormal intra or extra-axial fluid collections.  Visualized paranasal sinuses and mastoids are well pneumatized. No acute displaced skull fractures are identified.  IMPRESSION: 1. No acute intracranial abnormalities. 2. Mild chronic microvascular ischemic changes in the cerebral white matter.   Electronically Signed   By: Trudie Reed M.D.   On: 01/21/2015 11:59   Dg Chest Port 1 View  01/21/2015   CLINICAL DATA:  Respiratory failure  EXAM: PORTABLE CHEST - 1 VIEW  COMPARISON:  01/20/2015  FINDINGS: A single AP portable view of the chest demonstrates no focal airspace consolidation or alveolar edema. The lungs are grossly clear. There is no large effusion or pneumothorax. Cardiac and mediastinal contours appear unremarkable.  There is no significant  interval change.  IMPRESSION: No active cardiopulmonary disease   Electronically Signed   By: Ellery Plunk M.D.   On: 01/21/2015 06:02   Dg Chest Port 1 View  01/20/2015   CLINICAL DATA:  Respiratory failure and altered mental status.  EXAM: PORTABLE CHEST - 1 VIEW  COMPARISON:  None.  FINDINGS: The mediastinal contour is normal. The heart size is upper limits of normal. The aorta is tortuous. The lung volumes are low. There is mild elevation of right hemidiaphragm. There is no focal infiltrate, pulmonary edema, or pleural effusion. The visualized skeletal structures are unremarkable.  IMPRESSION: No active cardiopulmonary disease.   Electronically Signed   By: Sherian Rein M.D.   On: 01/20/2015 16:30    MRA HEAD IMPRESSION:  1. No proximal branch occlusion identified within the intracranial circulation. 2. Short-segment focal moderate stenoses within the cavernous ICAs and left M1 segment. 3. Heavy atheromatous disease within the right vertebral artery with secondary multi focal irregularity and attenuated flow related enhancement. 4. Widely patent left vertebral artery. 5. Mild to moderate atheromatous irregularity within the proximal basilar artery without flow-limiting stenosis. 6.  Distal branch atheromatous irregularity within the MCA and PCA branches bilaterally.  MRI brain 1. No acute intracranial infarct or other abnormality identified. 2. Generalized atrophy with mild chronic small vessel ischemic disease. 3. Please note that and MRV was not performed on this exam due to motion artifact and patient's inability to tolerate the length of the exam.   Raliegh Ip, DO 01/25/2015, 7:09 AM PGY-1, [facility] Family Medicine FPTS Intern pager: [phone], text pages welcome

## 2015-01-26 DIAGNOSIS — K5900 Constipation, unspecified: Secondary | ICD-10-CM | POA: Insufficient documentation

## 2015-01-26 LAB — BASIC METABOLIC PANEL
Anion gap: 4 — ABNORMAL LOW (ref 5–15)
BUN: 31 mg/dL — ABNORMAL HIGH (ref 6–23)
CALCIUM: 8.2 mg/dL — AB (ref 8.4–10.5)
CO2: 27 mmol/L (ref 19–32)
Chloride: 111 mmol/L (ref 96–112)
Creatinine, Ser: 1.91 mg/dL — ABNORMAL HIGH (ref 0.50–1.35)
GFR, EST AFRICAN AMERICAN: 41 mL/min — AB (ref >90)
GFR, EST NON AFRICAN AMERICAN: 36 mL/min — AB (ref >90)
Glucose, Bld: 237 mg/dL — ABNORMAL HIGH (ref 70–99)
Potassium: 3.3 mmol/L — ABNORMAL LOW (ref 3.5–5.1)
Sodium: 142 mmol/L (ref 135–145)

## 2015-01-26 LAB — CULTURE, BLOOD (ROUTINE X 2)
Culture: NO GROWTH
Culture: NO GROWTH

## 2015-01-26 LAB — CBC
HCT: 33.9 % — ABNORMAL LOW (ref 39.0–52.0)
Hemoglobin: 11.2 g/dL — ABNORMAL LOW (ref 13.0–17.0)
MCH: 32.5 pg (ref 26.0–34.0)
MCHC: 33 g/dL (ref 30.0–36.0)
MCV: 98.3 fL (ref 78.0–100.0)
Platelets: 99 10*3/uL — ABNORMAL LOW (ref 150–400)
RBC: 3.45 MIL/uL — ABNORMAL LOW (ref 4.22–5.81)
RDW: 13.7 % (ref 11.5–15.5)
WBC: 7.6 10*3/uL (ref 4.0–10.5)

## 2015-01-26 LAB — GLUCOSE, CAPILLARY
GLUCOSE-CAPILLARY: 84 mg/dL (ref 70–99)
Glucose-Capillary: 228 mg/dL — ABNORMAL HIGH (ref 70–99)
Glucose-Capillary: 231 mg/dL — ABNORMAL HIGH (ref 70–99)
Glucose-Capillary: 166 mg/dL — ABNORMAL HIGH (ref 70–99)

## 2015-01-26 MED ORDER — POTASSIUM CHLORIDE CRYS ER 20 MEQ PO TBCR
40.0000 meq | EXTENDED_RELEASE_TABLET | Freq: Once | ORAL | Status: AC
  Administered 2015-01-26: 40 meq via ORAL
  Filled 2015-01-26: qty 2

## 2015-01-26 MED ORDER — BISACODYL 10 MG RE SUPP
10.0000 mg | Freq: Once | RECTAL | Status: AC
  Administered 2015-01-26: 10 mg via RECTAL
  Filled 2015-01-26: qty 1

## 2015-01-26 NOTE — Progress Notes (Signed)
Family Medicine Teaching Service Daily Progress Note Intern Pager: [phone]  Patient name: Dave Cervantes Medical record number: [id number] Date of birth: [birth year] Age: 64 y.o. Gender: male  Primary Care Provider: No primary care provider on file. Consultants: Neurology, Cardiology Code Status: FULL  Pt Overview and Major Events to Date:  2/16: NSTEMI- elevated troponin with new inverted t-wave in anterior leads 2/17: DKA resolved, lantus started; Troponin trending down 2/18: AMS improved, but not resolved; Na of 160; transfer to tele 2/19: transferred to FPTS, heparin gtt stopped  Assessment and Plan: 64 year old [facility] with AMS x 2 days. In ED was lethargic and hyperglycemic (Glu 1400) with positive urine ketones. PCCM called for admission.  Patient tranferred to FMTS on 01/24/15.   #NSTEMI: Troponins trending down 2.94>>0.68. Echo shows no wall motion abnormalities and preserved EF. -c/s Cardiology: risk factor management, BB and LA nitrates as BP tolerates, Coronary angio when renal function allows, will cath when appropriate: signed off 2/19 until renal function improved. -Heparin gtt stopped 2/19 because of low platelets <95 -ASA 81  #Thrombocytopenia:  95>85>99 this morning.   -Heparin gtt dc'd 2/19 -continue to monitor with CBC daily  #DKA: CBG >1400 on admission.  A1c 13.7. CBGs 194-307/24 hours.  -c/s diabetic coordinator, appreciate recs -Will contact DM coordinator upon discharge for assistance with conversion to NovoLIN 70/30 upon discharge -Continue Novolog 70/30: 35 u BID while in hospital.  #Acute metabolic encephalopathy: CT head 2/16 no acute abnormalities.  MRI brain no acute processes.  MRA: as outlined below.  Ammonia 25.  No obvious home medications that would cause AMS.  EEG with nonspecific moderate encephalopathic changes.  Mental status improving.   -Neurology signed off on 01/23/15  #HTN: BP 135/88 this am -Continue lopressor 25  TID -f/u cards recs -Consider ACE-I low dose once AKI resolved   #ARF: Improving. Cr 1.91 today. (Baseline 2015 Cr 1.5) -Daily BMET -Encourage PO fluids  #Hypernatremia: Resolved.  Na 142 today. FeNa 0.49% on 2/15, suggests pre-renal. -SLIV -Daily BMET  #hypokalemia: K 3.3 this am -Kdur 40 x1 PO -Check Magnesium in am -Daily BMET -Replete PRN  #acute pancreatitis: Resolved. Lipase 2490 on admission, 313 on 2/17.  #hypothyroidism: On synthroid 150mcg at home.  TSH 2.144 -recheck in 6 weeks  #Constipation: -Senna PO -Dulcolax supp x1  FEN/GI: SLIV, Carb modified diet PPx: SCDs  Disposition: SNF vs HH once medically stable.  Subjective: Patient reports that he is doing well this morning.  We discussed his constipation.  He still was unable to have a BM after the senna tablet yesterday.  He is agreeable to dulcolax suppository.  Denies CP, SOB, N/V, abdominal pain, dizziness, headache.  We again discussed SNF vs HH.  He is confident that his sister will be available to him, despite having not discussed arrangement with her.  I informed him that I would pass this information to SW/CM to see what can be arranged.  Objective: Temp:  [98.1 F (36.7 C)-99 F (37.2 C)] 98.1 F (36.7 C) (02/21 0557) Pulse Rate:  [72-77] 72 (02/21 0557) Resp:  [18] 18 (02/21 0557) BP: (114-135)/(74-88) 135/88 mmHg (02/21 0557) SpO2:  [99 %-100 %] 100 % (02/21 0557)   I/O last 3 completed shifts: In: 1030 [P.O.:1030] Out: 4300 [Urine:4300]   Physical Exam: General: awake, alert, NAD, sitting up at bedside HENT: [place]/AT, EOMI, mildly dry MM, lips improved, poor dentition Cardiovascular: RRR, no m/r/g Respiratory: CTAB anteriorly, no increased WOB Abdomen: obese,  soft, NT/ND, +BS Extremities: WWP, no cyanosis, clubbing or edema. Neuro:  AAOx3, speech much improved, follows commands, 4/5 LE and UE strength Skin: healing excoriations on b/l shins  Laboratory:  Recent Labs Lab 01/24/15 0332  01/25/15 0540 01/26/15 0505  WBC 8.1 7.8 7.6  HGB 11.9* 12.2* 11.2*  HCT 37.1* 37.4* 33.9*  PLT 95* 85* 99*    Recent Labs Lab 01/20/15 1404  01/22/15 0314  01/24/15 1505 01/25/15 0543 01/26/15 0505  NA 141  < > 160*  < > 147* 146* 142  K 6.4*  < > 3.6  < > 3.2* 4.5 3.3*  CL 101  < > >130*  < > 116* 115* 111  CO2 12*  < > 23  < > BUN 92*  < > 67*  < > 36* 34* 31*  CREATININE 6.12*  < > 3.03*  < > 1.95* 2.05* 1.91*  CALCIUM 8.6  < > 8.2*  < > 7.5* 7.6* 8.2*  PROT 6.4  --  6.3  --   --   --   --   BILITOT 2.4*  --  1.0  --   --   --   --   ALKPHOS 88  --  76  --   --   --   --   ALT 29  --  37  --   --   --   --   AST 47*  --  125*  --   --   --   --   GLUCOSE 1399*  < > 333*  < > 233* 180* 237*  < > = values in this interval not displayed.  Cardiac Panel (last 3 results)  Recent Labs  01/25/15 0543 01/25/15 1142 01/25/15 1722  TROPONINI 1.27* 0.86* 0.68*   CBG (last 3)   Recent Labs  01/25/15 1212 01/25/15 1727 01/25/15 2240  GLUCAP 307* 256* 194*    Imaging/Diagnostic Tests: No new imaging  Raliegh Ip, DO 01/26/2015, 7:09 AM PGY-1, [facility] Family Medicine FPTS Intern pager: [phone], text pages welcome

## 2015-01-27 DIAGNOSIS — R531 Weakness: Secondary | ICD-10-CM

## 2015-01-27 LAB — BASIC METABOLIC PANEL
ANION GAP: 7 (ref 5–15)
BUN: 25 mg/dL — AB (ref 6–23)
CALCIUM: 8.5 mg/dL (ref 8.4–10.5)
CHLORIDE: 109 mmol/L (ref 96–112)
CO2: 23 mmol/L (ref 19–32)
Creatinine, Ser: 1.88 mg/dL — ABNORMAL HIGH (ref 0.50–1.35)
GFR calc Af Amer: 42 mL/min — ABNORMAL LOW (ref >90)
GFR calc non Af Amer: 36 mL/min — ABNORMAL LOW (ref >90)
Glucose, Bld: 194 mg/dL — ABNORMAL HIGH (ref 70–99)
Potassium: 3.3 mmol/L — ABNORMAL LOW (ref 3.5–5.1)
SODIUM: 139 mmol/L (ref 135–145)

## 2015-01-27 LAB — CULTURE, BLOOD (ROUTINE X 2): Culture: NO GROWTH

## 2015-01-27 LAB — MAGNESIUM: Magnesium: 2.3 mg/dL (ref 1.5–2.5)

## 2015-01-27 LAB — GLUCOSE, CAPILLARY
GLUCOSE-CAPILLARY: 181 mg/dL — AB (ref 70–99)
Glucose-Capillary: 173 mg/dL — ABNORMAL HIGH (ref 70–99)
Glucose-Capillary: 200 mg/dL — ABNORMAL HIGH (ref 70–99)
Glucose-Capillary: 219 mg/dL — ABNORMAL HIGH (ref 70–99)

## 2015-01-27 LAB — CBC
HCT: 37.4 % — ABNORMAL LOW (ref 39.0–52.0)
HEMOGLOBIN: 12.4 g/dL — AB (ref 13.0–17.0)
MCH: 32 pg (ref 26.0–34.0)
MCHC: 33.2 g/dL (ref 30.0–36.0)
MCV: 96.4 fL (ref 78.0–100.0)
Platelets: 146 10*3/uL — ABNORMAL LOW (ref 150–400)
RBC: 3.88 MIL/uL — AB (ref 4.22–5.81)
RDW: 13.2 % (ref 11.5–15.5)
WBC: 8.5 10*3/uL (ref 4.0–10.5)

## 2015-01-27 MED ORDER — POTASSIUM CHLORIDE CRYS ER 20 MEQ PO TBCR
40.0000 meq | EXTENDED_RELEASE_TABLET | Freq: Two times a day (BID) | ORAL | Status: DC
  Administered 2015-01-27 (×2): 40 meq via ORAL
  Filled 2015-01-27 (×4): qty 2

## 2015-01-27 NOTE — Progress Notes (Signed)
Inpatient Diabetes Program Recommendations  AACE/ADA: New Consensus Statement on Inpatient Glycemic Control (2013)  Target Ranges:  Prepandial:   less than 140 mg/dL      Peak postprandial:   less than 180 mg/dL (1-2 hours)      Critically ill patients:  140 - 180 mg/dL     Current orders for Inpatient glycemic control: 70/30 35 units BID, Novolog 0-20 units TID  Inpatient Diabetes Program Recommendations  Patient's glucose increased to 200 at lunch. Please consider ordering 70/30 38 units QAM and Keep 70/30 35 units QPM  Thanks,  Christena DeemShannon Ragna Kramlich RN, MSN, [facility] Inpatient Diabetes Coordinator Team Pager [phone]

## 2015-01-27 NOTE — Care Management Note (Addendum)
    Page 1 of 2   01/29/2015     1:37:35 PM CARE MANAGEMENT NOTE 01/29/2015  Patient:  Dave Cervantes,Dave Cervantes   Account Number:  [id number]  Date Initiated:  01/27/2015  Documentation initiated by:  Letha CapeAYLOR,Adeleigh Barletta  Subjective/Objective Assessment:   dx dka  admit- from home.     Action/Plan:   pt eval- rec CIR, CIR is not in network with [facility],patient can not affored to pay privately, patient will need hhrn and hhpt set up.   Anticipated DC Date:  01/29/2015   Anticipated DC Plan:  HOME W HOME HEALTH SERVICES      DC Planning Services  CM consult      [facility]   Choice offered to / List presented to:  C-1 Patient   DME arranged  WALKER - Lavone NianOLLING      [facility] [place]     HH arranged  HH-2 PT  HH-1 RN      [facility]   Status of service:  Completed, signed off Medicare Important Message given?  NO (If response is "NO", the following Medicare IM given date fields will be blank) Date Medicare IM given:   Medicare IM given by:   Date Additional Medicare IM given:   Additional Medicare IM given by:    Discharge Disposition:  HOME W HOME HEALTH SERVICES  Per UR Regulation:  Reviewed for med. necessity/level of care/duration of stay  If discussed at Long Length of Stay Meetings, dates discussed:   01/28/2015    Comments:  01/29/15 1334 Letha Capeeborah Raj Landress RN BSN [phone] patient is for dc today, [facility] notified, patient does not want hhot or shower stool, other hh services has been set up.  Also NCM spoke with [facility] rep April, and she states that the patient will need to make an appointment with his pcp Cchristel Keisler , [facility] and [facility] it.  01/27/15 1250 Letha Capeeborah Latron Ribas RN, BSN [phone] NCM received referral for [facility] and HHPT, NCM called April with [facility] [phone] left message for call back lto help set [facility]. [place]  [place].  NCM received call back from [place] with the [place] at 1433 stating that Jens SomKuiana Mock is patient 's Child psychotherapistocial Worker at [phone], NCM called Danford BadKuiana and left message that patient needs to be [facility] and single point cane.  Awaiting call back from [place].  NCM recieved call from [place] stating to fax orders to Hedwig MortonBrenda Lingle and Thomes DinningRebecca Dun at [phone].  Also to give [facility] the Nurse who will do the authorization for hh services, who is Alcide GoodnessLaura Kerns at [phone].  NCM also called Vernona RiegerLaura and left message for hh services for HHRN/PT. [facility], Miranda notified. NCM gave Miranda phone number for the [facility] for [facility]. The Va will deliver the rolling walker to patient's home by mail.

## 2015-01-27 NOTE — Progress Notes (Signed)
Patient Name: Dave Cervantes Date of Encounter: 01/27/2015  Active Problems:   DKA (diabetic ketoacidoses)   AKI (acute kidney injury)   Hyperkalemia   Encephalopathy, metabolic   Altered mental status   Type 1 diabetes mellitus with ketoacidosis and coma   NSTEMI (non-ST elevated myocardial infarction)   Venous thromboembolism   CN (constipation)   Primary Cardiologist: ?DM or HS  Patient Profile: 64 yo male w/ no previous cardiac hx, hx HTN, hypothyroid, admitted 02/15 w/ lethargy, incontinence, glucose 1400, DKA, AKI (K+ 6.4, Cr 6.12 on admit), hypernatremia, NSTEMI (peak trop 32). Initially on heparin, d/c'd when plt decreased (nadir 85).  SUBJECTIVE: No chest pain, no SOB. Has not ambulated in the halls, but is up and down in the room.  OBJECTIVE Filed Vitals:   01/26/15 1553 01/26/15 2145 01/26/15 2145 01/27/15 0540  BP: 145/75 111/56 111/56 135/80  Pulse: 76  71 72  Temp: 98.3 F (36.8 C)  99.4 F (37.4 C) 98.4 F (36.9 C)  TempSrc: Oral  Oral Oral  Resp: Height:      Weight:      SpO2: 100%  100% 100%    Intake/Output Summary (Last 24 hours) at 01/27/15 0949 Last data filed at 01/27/15 0830  Gross per 24 hour  Intake    840 ml  Output   1760 ml  Net   -920 ml   Filed Weights   01/20/15 1700  Weight: 233 lb 4 oz (105.8 kg)    PHYSICAL EXAM General: Well developed, well nourished, male in no acute distress. Head: Normocephalic, atraumatic.  Neck: Supple without bruits, JVD not elevated. Lungs:  Resp regular and unlabored, few rales, essentially clear. Heart: RRR, S1, S2, no S3, S4, 2/6 murmur; no rub. Abdomen: Soft, non-tender, non-distended, BS + x 4.  Extremities: No clubbing, cyanosis, 1+ edema.  Neuro: Alert and oriented X 3. Moves all extremities spontaneously. Psych: Normal affect.  LABS: CBC:  Recent Labs  01/26/15 0505 01/27/15 0848  WBC 7.6 8.5  HGB 11.2* 12.4*  HCT 33.9* 37.4*  MCV 98.3 96.4  PLT 99* 146*    Basic Metabolic Panel:  Recent Labs  16/10/96 0543 01/26/15 0505  NA 146* 142  K 4.5 3.3*  CL 115* 111  CO2 25 27  GLUCOSE 180* 237*  BUN 34* 31*  CREATININE 2.05* 1.91*  CALCIUM 7.6* 8.2*   Liver Function Tests: Lab Results  Component Value Date   ALT 37 01/22/2015   AST 125* 01/22/2015   ALKPHOS 76 01/22/2015   BILITOT 1.0 01/22/2015   Lab Results  Component Value Date   TSH 2.144 01/20/2015   Cardiac Enzymes: TROPONIN I  Date Value Ref Range Status  01/25/2015 0.68* <0.031 ng/mL Final  01/25/2015 1.27* <0.031 ng/mL Final  01/23/2015 6.39* <0.031 ng/mL Final  01/22/2015 15.45* <0.031 ng/mL Final  01/21/2015 20.78* <0.031 ng/mL Final  01/21/2015 25.20* <0.031 ng/mL Final  01/21/2015 32.19* <0.031 ng/mL Final  01/21/2015 28.37* <0.031 ng/mL Final  01/20/2015 12.15* <0.031 ng/mL Final  01/20/2015 3.94* <0.031 ng/mL Final   Lab Results  Component Value Date   CHOL 251* 01/21/2015   HDL 25* 01/21/2015   LDLCALC UNABLE TO CALCULATE IF TRIGLYCERIDE OVER 400 mg/dL 04/54/0981   TRIG 191* 01/21/2015   CHOLHDL 10.0 01/21/2015   Lab Results  Component Value Date   HGBA1C 13.7* 01/20/2015    TELE:  SR, no sig ectopy     Radiology/Studies: Dg Chest [employer]  1 View 01/21/2015   CLINICAL DATA:  Respiratory failure  EXAM: PORTABLE CHEST - 1 VIEW  COMPARISON:  01/20/2015  FINDINGS: A single AP portable view of the chest demonstrates no focal airspace consolidation or alveolar edema. The lungs are grossly clear. There is no large effusion or pneumothorax. Cardiac and mediastinal contours appear unremarkable.  There is no significant interval change.  IMPRESSION: No active cardiopulmonary disease   Electronically Signed   By: Ellery Plunkaniel R Mitchell M.D.   On: 01/21/2015 06:02    Current Medications:  . aspirin  81 mg Oral Daily  . atorvastatin  80 mg Oral q1800  . insulin aspart  0-20 Units Subcutaneous TID WC  . insulin aspart protamine- aspart  35 Units Subcutaneous BID WC   . levothyroxine  150 mcg Oral QAC breakfast  . metoprolol tartrate  25 mg Oral 3 times per day  . senna  1 tablet Oral Daily      ASSESSMENT AND PLAN:   NSTEMI (non-ST elevated myocardial infarction) - pt on ASA, statin, BB - no hx chest pain - EF 65-70% with no obvious RWMA on echo - cath initially not pursued due to altered mental status, severity of acute illness, plus poor renal function. Renal function has improved, but may not be back to baseline.  - Based on current labs, risk of contrast nephropathy 14%, risk of HD 0.12% with 50 cc of contrast.  - Pt does not have a strong feeling on pursuing cath now vs outpatient - Best option is cath before d/c, to know what we are dealing with. - [facility] would need to clarify where his OP cardiology care would be.  - will recheck ECG - tentatively plan for cors only tomorrow, recheck BMET in am first.    Hypokalemia - got 40 meq yesterday, will leave to IM, but would give another 40 meq today.  Otherwise, per IM. Pt has improved to the point that they are doing d/c planning- pt for rehab or short-term SNF.  Active Problems:   DKA (diabetic ketoacidoses)   AKI (acute kidney injury)   Hyperkalemia   Encephalopathy, metabolic   Altered mental status   Type 1 diabetes mellitus with ketoacidosis and coma   Venous thromboembolism   CN (constipation)  Signed, Theodore Demarkhonda Barrett , PA-C 9:49 AM 01/27/2015  I have personally seen and examined this patient with Theodore Demarkhonda Barrett, PA-C. I agree with the assessment and plan as outlined above. He is now stable and has no complaints of SOB or CP. He ambulated in the hallways this am. Admitted with DKA and significant troponin elevation. LVEF is normal. As outlined by Dr. Verdis PrimeHenry Dekay last week, will need cardiac cath but this has been delayed based on altered mental status and renal insufficiency. I think this should be done before discharge. Creatinine was over 6.0 on  admission and now down to 1.88. If renal function continues to improve, will consider cath later this week. I will see him tomorrow and based on renal function, make further recs regarding timing of cardiac cath. Continue ASA, beta blocker and statin.   Rily Nickey 01/27/2015 12:07 PM

## 2015-01-27 NOTE — Progress Notes (Signed)
Physical Therapy Treatment Patient Details Name: Dave Cervantes MRN: [id number] DOB: 12/31/1950 Today's Date: 01/27/2015    History of Present Illness 64 yo with history of HTN and hypothyroidism was admitted on 2/15 with lethargy and fecal/urinary incontinence. He was found to have glucose 1400 with DKA, AKI, and hyperkalemia.Pt with hypernatremia and NSTEMI along with AMS    PT Comments    Pt much improved today and no longer needing +2 A for transfers.  He was able to ambulate 100' with RW with MIN/guard.  Recommend RW, HHPT, and 24 hour family assistance, which he reports is available.  Follow Up Recommendations  Home health PT;Supervision/Assistance - 24 hour     Equipment Recommendations  Rolling walker with 5" wheels    Recommendations for Other Services       Precautions / Restrictions Precautions Precautions: Fall Restrictions Weight Bearing Restrictions: No    Mobility  Bed Mobility                  Transfers Overall transfer level: Needs assistance Equipment used: Rolling walker (2 wheeled) Transfers: Sit to/from Stand Sit to Stand: Min guard         General transfer comment: stood with min/guard from recliner  Ambulation/Gait Ambulation/Gait assistance: Min guard Ambulation Distance (Feet): 100 Feet Assistive device: Rolling walker (2 wheeled) Gait Pattern/deviations: Step-through pattern;Trunk flexed Gait velocity: decreased   General Gait Details: Pt ambulates with flexed posture and with RW too far forward.  Did respond to cueing, but would then let RW get away again.   Stairs            Wheelchair Mobility    Modified Rankin (Stroke Patients Only)       Balance Overall balance assessment: Needs assistance         Standing balance support: Bilateral upper extremity supported Standing balance-Leahy Scale: Fair                      Cognition Arousal/Alertness: Awake/alert Behavior During Therapy: WFL for tasks  assessed/performed Overall Cognitive Status: Within Functional Limits for tasks assessed         Following Commands: Follows one step commands consistently       General Comments: Pt up in recliner upon arrival    Exercises      General Comments        Pertinent Vitals/Pain Pain Assessment: No/denies pain    Home Living                      Prior Function            PT Goals (current goals can now be found in the care plan section) Acute Rehab PT Goals PT Goal Formulation: With patient Time For Goal Achievement: 02/06/15 Potential to Achieve Goals: Good Progress towards PT goals: Progressing toward goals;Goals met and updated - see care plan    Frequency  Min 3X/week    PT Plan Discharge plan needs to be updated    Co-evaluation             End of Session Equipment Utilized During Treatment: Gait belt Activity Tolerance: Patient tolerated treatment well Patient left: in chair;with call bell/phone within reach;with chair alarm set     Time: 3903-0092 PT Time Calculation (min) (ACUTE ONLY): 15 min  Charges:  $Gait Training: 8-22 mins                    G  Codes:      Esquer,Daxson Reffett LUBECK 01/27/2015, 2:28 PM

## 2015-01-27 NOTE — Progress Notes (Cosign Needed)
Family Medicine Teaching Service Daily Progress Note Intern Pager: [phone]  Patient name: Dave Cervantes Medical record number: [id number] Date of birth: 1951/01/24 Age: 64 y.o. Gender: male  Primary Care Provider: No primary care provider on file. Consultants: Neurology, Cardiology Code Status: FULL  Pt Overview and Major Events to Date:  2/16: NSTEMI- elevated troponin with new inverted t-wave in anterior leads 2/17: DKA resolved, lantus started; Troponin trending down 2/18: AMS improved, but not resolved; Na of 160; transfer to tele 2/19: transferred to FPTS, heparin gtt stopped  Assessment and Plan: 64 year old male presented to [facility] ED 2/15 with AMS x 2 days. In ED was lethargic and hyperglycemic (Glu 1400) with positive urine ketones. PCCM called for admission.  Patient tranferred to FMTS on 01/24/15.   #NSTEMI: Troponins trending down 2.94>>0.68. Echo shows no wall motion abnormalities and preserved EF. -c/s Cardiology: risk factor management, BB and LA nitrates as BP tolerates, Coronary angio when renal function allows, will cath when appropriate: signed off 2/19 until renal function improved.  Spoke to [place] this am.  She will add him back on list to be seen today.  Considering inpatient cath vs outpatient at this point. -Heparin gtt stopped 2/19 because of low platelets <95 -ASA 81  #Thrombocytopenia:  95>85>99>147.  -Heparin gtt dc'd 2/19 -continue to monitor with CBC daily -HIT panel ordered  #Weakness:  -Patient ineligible for CIR -SNF vs HH.  Patient declining SNF.   -Called Ms Stanton Kidney CM to see if [facility] can be arranged. -PT to see today.  #DKA: CBG >1400 on admission.  A1c 13.7. CBGs 84-237/24 hours.  -c/s diabetic coordinator, appreciate recs -Will contact DM coordinator upon discharge for assistance with conversion to NovoLIN 70/30 upon discharge -Continue Novolog 70/30: 35 u BID while in hospital.  #Acute metabolic encephalopathy: CT head 2/16 no acute abnormalities.   MRI brain no acute processes.  MRA: as outlined below.  Ammonia 25.  No obvious home medications that would cause AMS.  EEG with nonspecific moderate encephalopathic changes.  Mental status improving.   -Neurology signed off on 01/23/15  #HTN: BP 135/88 this am -Continue lopressor 25 TID -f/u cards recs -Consider ACE-I low dose once AKI resolved   #ARF: Improving. Cr 1.88 today. (Baseline 2015 Cr 1.5) -Daily BMET -Encourage PO fluids  #Hypernatremia: Resolved.  Na 139 today. FeNa 0.49% on 2/15, suggests pre-renal. -SLIV -Daily BMET  #hypokalemia: K 3.3 this am Mg pending - KDUR 40 x2 PO -Daily BMET -Replete PRN  #acute pancreatitis: Resolved. Lipase 2490 on admission, 313 on 2/17.  #hypothyroidism: On synthroid at home.  TSH 2.144 -recheck in 6 weeks  #Constipation: BM yesterday s/p dulcolax supp -Senna PO  FEN/GI: SLIV, Carb modified diet PPx: SCDs  Disposition: SNF vs HH once medically stable.  Subjective: Patient reports that he is doing well this am.  He has just finished watching some diabetic teaching videos.  He reports that he had a BM after drinking milk yesterday.  He is eager to go home.  We discussed need for PT assessment and continued assistance with HH by CSW.  He reports that he discussed arrangements with sister, who will stay with him 24/d.  In addition, he reports that he has a lot of additional support from brother and nephews.  He denies CP, SOB, nausea, vomiting, abdominal pain.  Objective: Temp:  [98.3 F (36.8 C)-99.4 F (37.4 C)] 98.4 F (36.9 C) (02/22 0540) Pulse Rate:  [71-76] 72 (02/22 0540) Resp:  [16] 16 (  02/22 0540) BP: (108-145)/(56-80) 135/80 mmHg (02/22 0540) SpO2:  [100 %] 100 % (02/22 0540)   I/O last 3 completed shifts: In: 1390 [P.O.:1390] Out: 2200 [Urine:2200] Total I/O In: -  Out: 260 [Urine:260] Physical Exam: General: awake, alert, NAD, sitting up at bedside reading, phlebotomist at bedside HENT: [place]/AT, EOMI,  MMM, lips improved, poor dentition Cardiovascular: RRR, no m/r/g Respiratory: CTAB anteriorly, no increased WOB Abdomen: obese, soft, NT/ND, +BS Extremities: WWP, no cyanosis, clubbing or edema. Neuro:  AAOx3, speech much improved, follows commands, 4/5 LE and UE strength Skin: healing excoriations on b/l shins  Laboratory:  Recent Labs Lab 01/25/15 0540 01/26/15 0505 01/27/15 0848  WBC 7.8 7.6 8.5  HGB 12.2* 11.2* 12.4*  HCT 37.4* 33.9* 37.4*  PLT 85* 99* 146*    Recent Labs Lab 01/20/15 1404  01/22/15 0314  01/25/15 0543 01/26/15 0505 01/27/15 0848  NA 141  < > 160*  < > 146* 142 139  K 6.4*  < > 3.6  < > 4.5 3.3* 3.3*  CL 101  < > >130*  < > 115* 111 109  CO2 12*  < > 23  < > 25 27 23   BUN 92*  < > 67*  < > 34* 31* 25*  CREATININE 6.12*  < > 3.03*  < > 2.05* 1.91* 1.88*  CALCIUM 8.6  < > 8.2*  < > 7.6* 8.2* 8.5  PROT 6.4  --  6.3  --   --   --   --   BILITOT 2.4*  --  1.0  --   --   --   --   ALKPHOS 88  --  76  --   --   --   --   ALT 29  --  37  --   --   --   --   AST 47*  --  125*  --   --   --   --   GLUCOSE 1399*  < > 333*  < > 180* 237* 194*  < > = values in this interval not displayed.  Cardiac Panel (last 3 results)  Recent Labs  01/25/15 0543 01/25/15 1142 01/25/15 1722  TROPONINI 1.27* 0.86* 0.68*   CBG (last 3)   Recent Labs  01/26/15 2140 01/27/15 0753 01/27/15 1207  GLUCAP 84 173* 200*    Imaging/Diagnostic Tests: No new imaging  Raliegh Ipshly M Kamorah Nevils, DO 01/27/2015, 12:30 PM PGY-1, [facility] FPTS Intern pager: [phone], text pages welcome

## 2015-01-27 NOTE — Evaluation (Signed)
Occupational Therapy Evaluation Patient Details Name: Dave LevinsWilliam Sizemore MRN: [id number] DOB: 1951/08/04 Today's Date: 01/27/2015    History of Present Illness 64 yo with history of HTN and hypothyroidism was admitted on 2/15 with lethargy and fecal/urinary incontinence. He was found to have glucose 1400 with DKA, AKI, and hyperkalemia.Pt with hypernatremia and NSTEMI along with AMS   Clinical Impression   Pt was independent prior to admission.  Requiring supervision for safety due to balance impairment and use of RW.  Will follow acutely.    Follow Up Recommendations  Home health OT    Equipment Recommendations  Tub/shower seat    Recommendations for Other Services       Precautions / Restrictions Precautions Precautions: Fall Restrictions Weight Bearing Restrictions: No      Mobility Bed Mobility   Bed Mobility: Sit to Supine       Sit to supine: Supervision      Transfers Overall transfer level: Needs assistance Equipment used: Rolling walker (2 wheeled) Transfers: Sit to/from Stand Sit to Stand: Supervision         General transfer comment: supervision, good technique from recliner and BSC    Balance Overall balance assessment: Needs assistance   Sitting balance-Leahy Scale: Good     Standing balance support: Bilateral upper extremity supported Standing balance-Leahy Scale: Fair                              ADL Overall ADL's : Needs assistance/impaired Eating/Feeding: Independent;Sitting   Grooming: Wash/dry hands;Supervision/safety;Standing   Upper Body Bathing: Set up;Sitting   Lower Body Bathing: Supervison/ safety;Sit to/from stand   Upper Body Dressing : Set up;Sitting   Lower Body Dressing: Supervision/safety;Sit to/from stand   Toilet Transfer: Supervision/safety;Ambulation;BSC;RW   Toileting- Clothing Manipulation and Hygiene: Supervision/safety;Sit to/from stand       Functional mobility during ADLs:  Supervision/safety;Rolling walker;Min guard       Vision     Perception     Praxis      Pertinent Vitals/Pain Pain Assessment: No/denies pain     Hand Dominance Right   Extremity/Trunk Assessment Upper Extremity Assessment Upper Extremity Assessment: Overall WFL for tasks assessed   Lower Extremity Assessment Lower Extremity Assessment: Defer to PT evaluation   Cervical / Trunk Assessment Cervical / Trunk Assessment: Normal   Communication Communication Communication: No difficulties   Cognition Arousal/Alertness: Awake/alert Behavior During Therapy: WFL for tasks assessed/performed Overall Cognitive Status: Impaired/Different from baseline Area of Impairment: Memory     Memory: Decreased short-term memory Following Commands: Follows one step commands consistently       General Comments: Pt with some mild memory deficits mostly related to events of the last month.  Using a notebook to compensate.   General Comments       Exercises       Shoulder Instructions      Home Living Family/patient expects to be discharged to:: Private residence Living Arrangements: Other relatives Available Help at Discharge: Family;Available 24 hours/day Type of Home: House Home Access: Stairs to enter Entergy CorporationEntrance Stairs-Number of Steps: 2   Home Layout: Two level Alternate Level Stairs-Number of Steps: 5 Alternate Level Stairs-Rails: Right Bathroom Shower/Tub: Chief Strategy OfficerTub/shower unit   Bathroom Toilet: Standard     Home Equipment: None          Prior Functioning/Environment Level of Independence: Independent             OT Diagnosis: Generalized weakness;Cognitive deficits  OT Problem List: Decreased activity tolerance;Impaired balance (sitting and/or standing);Decreased cognition;Decreased knowledge of use of DME or AE   OT Treatment/Interventions: Self-care/ADL training;Patient/family education;Balance training;DME and/or AE instruction    OT Goals(Current goals  can be found in the care plan section) Acute Rehab OT Goals Patient Stated Goal: return home as soon as possible OT Goal Formulation: With patient Time For Goal Achievement: 02/10/15 Potential to Achieve Goals: Good ADL Goals Pt Will Perform Grooming: with modified independence;standing Pt Will Perform Lower Body Bathing: with modified independence;sit to/from stand Pt Will Perform Lower Body Dressing: with modified independence;sit to/from stand Pt Will Transfer to Toilet: with modified independence;ambulating;regular height toilet Pt Will Perform Toileting - Clothing Manipulation and hygiene: with modified independence;sit to/from stand Pt Will Perform Tub/Shower Transfer: Tub transfer;with supervision;rolling walker;ambulating;shower seat  OT Frequency: Min 2X/week   Barriers to D/C:            Co-evaluation              End of Session Equipment Utilized During Treatment: Engineer, water Communication: Mobility status  Activity Tolerance: Patient tolerated treatment well Patient left: in bed;with call bell/phone within reach;with bed alarm set   Time: 1425-1456 OT Time Calculation (min): 31 min Charges:  OT General Charges $OT Visit: 1 Procedure OT Evaluation $Initial OT Evaluation Tier I: 1 Procedure OT Treatments $Self Care/Home Management : 8-22 mins G-Codes:    Evern Bio 01/27/2015, 3:03 PM  [phone]

## 2015-01-27 NOTE — Clinical Social Work Psychosocial (Signed)
Clinical Social Work Department BRIEF PSYCHOSOCIAL ASSESSMENT 01/27/2015  Patient:  Dave Cervantes, Dave Cervantes     Account Number:  [id number]     Admit date:  01/20/2015  Clinical Social Worker:  Lovey Newcomer  Date/Time:  01/26/2015 04:00 PM  Referred by:  Physician  Date Referred:  01/26/2015 Referred for  SNF Placement   Other Referral:   NA   Interview type:  Patient Other interview type:   Patient alert and oriented at time of assessment.    PSYCHOSOCIAL DATA Living Status:  ALONE Admitted from facility:   Level of care:   Primary support name:  Dave Cervantes Primary support relationship to patient:  SIBLING Degree of support available:   Support is good.    CURRENT CONCERNS Current Concerns  Post-Acute Placement   Other Concerns:   NA    SOCIAL WORK ASSESSMENT / PLAN CSW met with patient at bedside to complete assessment. PT has recommended SNF placement for patient as patient is not a CIR candidate due to insurance. Patient reports that he was admitted from home alone but will be able to DC back home where his sister will help him recover. CSW explained that the treatment team feels SNF placement for rehab would be best option for patient, but the patient continues to refuse. CSW explained that if patient does not want SNF, home with home health would be an option. Patient states that this is what he would like at discharge.    Overall patient present with normal affect and willingness to engage in assessment, but ultimately refuses SNF placement at this time. CSW explained to patient that he is likely nearing his discharge date and would be expected to return home when discharged. Patient states that he understands his discharge is close and still believes he can manage at home. Patient confirms that he has spoken with his sister and that she has agreed to help him.   Assessment/plan status:  No Further Intervention Required Other assessment/ plan:   NONE    Information/referral to community resources:   NONE    PATIENT'S/FAMILY'S RESPONSE TO PLAN OF CARE: Patient is refusing SNF placement and requests to go home with [facility] services. CSW signing off at this time as there are no other CSW related needs.       Liz Beach MSW, [place], [place], [phone]

## 2015-01-28 ENCOUNTER — Encounter (HOSPITAL_COMMUNITY)
Admission: EM | Disposition: A | Discharge status: Home Health | Payer: Self-pay | Source: Home / Self Care | Attending: Family Medicine

## 2015-01-28 ENCOUNTER — Encounter (HOSPITAL_COMMUNITY): Payer: Self-pay | Admitting: Cardiology

## 2015-01-28 ENCOUNTER — Other Ambulatory Visit: Payer: Self-pay | Admitting: *Deleted

## 2015-01-28 DIAGNOSIS — I2510 Atherosclerotic heart disease of native coronary artery without angina pectoris: Secondary | ICD-10-CM

## 2015-01-28 DIAGNOSIS — E119 Type 2 diabetes mellitus without complications: Secondary | ICD-10-CM

## 2015-01-28 HISTORY — PX: LEFT HEART CATHETERIZATION WITH CORONARY ANGIOGRAM: SHX5451

## 2015-01-28 LAB — CBC
HEMATOCRIT: 29.7 % — AB (ref 39.0–52.0)
HEMOGLOBIN: 10 g/dL — AB (ref 13.0–17.0)
MCH: 32.6 pg (ref 26.0–34.0)
MCHC: 34 g/dL (ref 30.0–36.0)
MCV: 95.8 fL (ref 78.0–100.0)
Platelets: 161 10*3/uL (ref 150–400)
RBC: 3.1 MIL/uL — AB (ref 4.22–5.81)
RDW: 13.1 % (ref 11.5–15.5)
WBC: 7 10*3/uL (ref 4.0–10.5)

## 2015-01-28 LAB — BASIC METABOLIC PANEL
ANION GAP: 5 (ref 5–15)
BUN: 22 mg/dL (ref 6–23)
CO2: 24 mmol/L (ref 19–32)
Calcium: 8.2 mg/dL — ABNORMAL LOW (ref 8.4–10.5)
Chloride: 112 mmol/L (ref 96–112)
Creatinine, Ser: 1.88 mg/dL — ABNORMAL HIGH (ref 0.50–1.35)
GFR calc non Af Amer: 36 mL/min — ABNORMAL LOW (ref >90)
GFR, EST AFRICAN AMERICAN: 42 mL/min — AB (ref >90)
Glucose, Bld: 227 mg/dL — ABNORMAL HIGH (ref 70–99)
POTASSIUM: 3.5 mmol/L (ref 3.5–5.1)
SODIUM: 141 mmol/L (ref 135–145)

## 2015-01-28 LAB — GLUCOSE, CAPILLARY
GLUCOSE-CAPILLARY: 202 mg/dL — AB (ref 70–99)
GLUCOSE-CAPILLARY: 197 mg/dL — AB (ref 70–99)
GLUCOSE-CAPILLARY: 207 mg/dL — AB (ref 70–99)
GLUCOSE-CAPILLARY: 192 mg/dL — AB (ref 70–99)
Glucose-Capillary: 196 mg/dL — ABNORMAL HIGH (ref 70–99)

## 2015-01-28 LAB — HEPARIN INDUCED THROMBOCYTOPENIA PNL: Heparin Induced Plt Ab: 0.152 OD (ref 0.000–0.400)

## 2015-01-28 LAB — PROTIME-INR
INR: 1.16 (ref 0.00–1.49)
PROTHROMBIN TIME: 15 s (ref 11.6–15.2)

## 2015-01-28 SURGERY — LEFT HEART CATHETERIZATION WITH CORONARY ANGIOGRAM
Anesthesia: LOCAL

## 2015-01-28 MED ORDER — MIDAZOLAM HCL 2 MG/2ML IJ SOLN
INTRAMUSCULAR | Status: AC
Start: 2015-01-28 — End: 2015-01-28
  Filled 2015-01-28: qty 2

## 2015-01-28 MED ORDER — SODIUM CHLORIDE 0.9 % IJ SOLN: 3.0000 mL | INTRAMUSCULAR | Status: DC | PRN

## 2015-01-28 MED ORDER — HEPARIN (PORCINE) IN NACL 2-0.9 UNIT/ML-% IJ SOLN
INTRAMUSCULAR | Status: AC
  Filled 2015-01-28: qty 1000

## 2015-01-28 MED ORDER — VERAPAMIL HCL 2.5 MG/ML IV SOLN
INTRAVENOUS | Status: AC
  Filled 2015-01-28: qty 2

## 2015-01-28 MED ORDER — INSULIN ASPART PROT & ASPART (70-30 MIX) 100 UNIT/ML ~~LOC~~ SUSP
38.0000 [IU] | Freq: Every day | SUBCUTANEOUS | Status: DC
  Administered 2015-01-29: 38 [IU] via SUBCUTANEOUS
  Filled 2015-01-28: qty 10

## 2015-01-28 MED ORDER — SODIUM CHLORIDE 0.9 % IV SOLN
1.0000 mL/kg/h | INTRAVENOUS | Status: DC
  Administered 2015-01-28: 1 mL/kg/h via INTRAVENOUS

## 2015-01-28 MED ORDER — INSULIN ASPART PROT & ASPART (70-30 MIX) 100 UNIT/ML ~~LOC~~ SUSP
35.0000 [IU] | Freq: Every day | SUBCUTANEOUS | Status: DC
  Filled 2015-01-28: qty 10

## 2015-01-28 MED ORDER — SODIUM CHLORIDE 0.9 % IV SOLN: 250.0000 mL | INTRAVENOUS | Status: DC | PRN

## 2015-01-28 MED ORDER — ASPIRIN 81 MG PO CHEW
324.0000 mg | CHEWABLE_TABLET | ORAL | Status: AC
Start: 2015-01-28 — End: 2015-01-28
  Administered 2015-01-28: 324 mg via ORAL
  Filled 2015-01-28: qty 4

## 2015-01-28 MED ORDER — LIDOCAINE HCL (PF) 1 % IJ SOLN
INTRAMUSCULAR | Status: AC
  Filled 2015-01-28: qty 30

## 2015-01-28 MED ORDER — SODIUM CHLORIDE 0.9 % IJ SOLN: 3.0000 mL | Freq: Two times a day (BID) | INTRAMUSCULAR | Status: DC

## 2015-01-28 MED ORDER — NITROGLYCERIN 1 MG/10 ML FOR IR/CATH LAB
INTRA_ARTERIAL | Status: AC
  Filled 2015-01-28: qty 10

## 2015-01-28 MED ORDER — SODIUM CHLORIDE 0.9 % IJ SOLN
3.0000 mL | Freq: Two times a day (BID) | INTRAMUSCULAR | Status: DC
  Administered 2015-01-28 – 2015-01-29 (×2): 3 mL via INTRAVENOUS

## 2015-01-28 NOTE — Progress Notes (Signed)
     SUBJECTIVE: No chest pain or SOB.   BP 130/89 mmHg  Pulse 73  Temp(Src) 98.3 F (36.8 C) (Oral)  Resp 18  Ht 6' (1.829 m)  Wt 221 lb 1.9 oz (100.3 kg)  BMI 29.98 kg/m2  SpO2 98%  Intake/Output Summary (Last 24 hours) at 01/28/15 0754 Last data filed at 01/28/15 0700  Gross per 24 hour  Intake    444 ml  Output   1260 ml  Net   -816 ml    PHYSICAL EXAM General: Well developed, well nourished, in no acute distress. Alert and oriented x 3.  Psych:  Good affect, responds appropriately Neck: No JVD. No masses noted.  Lungs: Clear bilaterally with no wheezes or rhonci noted.  Heart: RRR with no murmurs noted. Abdomen: Bowel sounds are present. Soft, non-tender.  Extremities: No lower extremity edema.   LABS: Basic Metabolic Panel:  Recent Labs  01/27/15 0848 01/28/15 0549  NA 139 141  K 3.3* 3.5  CL 109 112  CO2 23 24  GLUCOSE 194* 227*  BUN 25* 22  CREATININE 1.88* 1.88*  CALCIUM 8.5 8.2*  MG 2.3  --    CBC:  Recent Labs  01/27/15 0848 01/28/15 0549  WBC 8.5 7.0  HGB 12.4* 10.0*  HCT 37.4* 29.7*  MCV 96.4 95.8  PLT 146* 161   Cardiac Enzymes:  Recent Labs  01/25/15 1142 01/25/15 1722  TROPONINI 0.86* 0.68*    Current Meds: . aspirin  81 mg Oral Daily  . atorvastatin  80 mg Oral q1800  . insulin aspart  0-20 Units Subcutaneous TID WC  . insulin aspart protamine- aspart  35 Units Subcutaneous BID WC  . levothyroxine  150 mcg Oral QAC breakfast  . metoprolol tartrate  25 mg Oral 3 times per day  . potassium chloride  40 mEq Oral BID  . senna  1 tablet Oral Daily  . sodium chloride  3 mL Intravenous Q12H     ASSESSMENT AND PLAN:  1. NSTEMI: Pt admitted with DKA and had significant troponin elevation (over 32). Cardiac cath later today to define coronary anatomy. Coronaries only with renal insufficiency. He is on for 6th case which will not happen before 4pm. Will give clear liquid breakfast then NPO.   2. Acute on chronic renal  failure: Creatinine now stable at 1.88. Was as high as 6.0.   3. DM: per primary team    4. Hypokalemia: Replace K this am.   Dave Cervantes,Dave Cervantes  2/23/20167:54 AM  

## 2015-01-28 NOTE — Progress Notes (Signed)
TR Band down 3cc's to 8 cc's.

## 2015-01-28 NOTE — Progress Notes (Signed)
Physical Therapy Treatment Patient Details Name: Dave LevinsWilliam Oliveri MRN: [id number] DOB: June 27, 1951 Today's Date: 01/28/2015    History of Present Illness 64 yo with history of HTN and hypothyroidism was admitted on 2/15 with lethargy and fecal/urinary incontinence. He was found to have glucose 1400 with DKA, AKI, and hyperkalemia.Pt with hypernatremia and NSTEMI along with AMS    PT Comments    Pt making steady progress with ambulation with cues for position of RW at times.  Continue to recommend HHPT, RW, and home with family support.  Follow Up Recommendations  Home health PT;Supervision/Assistance - 24 hour     Equipment Recommendations  Rolling walker with 5" wheels    Recommendations for Other Services       Precautions / Restrictions Precautions Precautions: Fall Restrictions Weight Bearing Restrictions: No    Mobility  Bed Mobility Overal bed mobility: Modified Independent Bed Mobility: Supine to Sit     Supine to sit: Modified independent (Device/Increase time);HOB elevated Sit to supine: Modified independent (Device/Increase time);HOB elevated   General bed mobility comments: With HOB elevated, pt able to supine <>sit with use of rail and MOD I  Transfers Overall transfer level: Needs assistance Equipment used: Rolling walker (2 wheeled) Transfers: Sit to/from Stand Sit to Stand: Supervision         General transfer comment: S from bed  Ambulation/Gait Ambulation/Gait assistance: Min guard Ambulation Distance (Feet): 200 Feet Assistive device: Rolling walker (2 wheeled) Gait Pattern/deviations: Step-through pattern;Trunk flexed Gait velocity: WNL   General Gait Details: Cues to stay within RW.  In room, cueing to keep RW with him.  Was able to side step without UE A.   Stairs            Wheelchair Mobility    Modified Rankin (Stroke Patients Only)       Balance     Sitting balance-Leahy Scale: Good       Standing balance-Leahy  Scale: Fair                      Cognition Arousal/Alertness: Awake/alert Behavior During Therapy: WFL for tasks assessed/performed Overall Cognitive Status: Within Functional Limits for tasks assessed Area of Impairment: Memory     Memory: Decreased short-term memory Following Commands: Follows one step commands consistently            Exercises      General Comments General comments (skin integrity, edema, etc.): Pt scheduled for cardiac cath later today.      Pertinent Vitals/Pain Pain Assessment: No/denies pain    Home Living                      Prior Function            PT Goals (current goals can now be found in the care plan section) Acute Rehab PT Goals Patient Stated Goal: return home as soon as possible PT Goal Formulation: With patient Time For Goal Achievement: 02/06/15 Potential to Achieve Goals: Good Progress towards PT goals: Progressing toward goals    Frequency  Min 3X/week    PT Plan Current plan remains appropriate    Co-evaluation             End of Session Equipment Utilized During Treatment: Gait belt Activity Tolerance: Patient tolerated treatment well Patient left: in bed;with nursing/sitter in room;with call bell/phone within reach     Time: 0859-0915 PT Time Calculation (min) (ACUTE ONLY): 16 min  Charges:  $Gait Training:  8-22 mins                    G Codes:      Hendler,Elaf Clauson LUBECK 01/28/2015, 9:34 AM

## 2015-01-28 NOTE — Progress Notes (Signed)
Instructions regarding rt wrist restrictions discussed w/patient.

## 2015-01-28 NOTE — Consult Note (Signed)
[address]       [place] [address]             [phone]        Breckon Whicker Beckett Medical Record #[id number] Date of Birth: 1951-06-27  Referring:Dr Ellyn Hack Primary Care: [facility]   Chief Complaint:    Chief Complaint  Patient presents with  . Altered Mental Status   severe multivessel coronary artery disease  History of Present Illness: The patient is a 64 year old male who presented to [facility] emergency department on 01/20/2015 with acute mental status changes for 2 days. He was found to have a significantly elevated glucose of 1400 and was diagnosed with DKA. He was admitted through the critical care medicine service.   In the ER he was found to have renal failure, hyperglycemia, hypernatremia,  hyperkalemia and acidosis. He was medically stabilized. During his evaluation he was found to have elevated troponin and ruled in for non-STEMI. He was seen in cardiology consultation. Troponin peaked at 32. He was felt to require aggressive ongoing medical management for stabilization and would require cardiac catheterization. This was done on today's date and he was found to have  coronary artery disease. He has also been followed by neurology due to his presenting mental status changes and also findings of left-sided weakness. These symptoms have improved over time and findings are most related to hypernatremia as well as acute encephalopathy. We are asked to see the patient in consultation for consideration of coronary artery bypass grafting. Before this admission patient was not aware he had DM. He gives no previous history of MI or known CAD    Current Activity/ Functional Status: He is using a rolling walker.  Current PT evaluation     Mobility  Bed Mobility Overal bed mobility: Modified Independent Bed Mobility: Supine to Sit     Supine to sit: Modified independent (Device/Increase time);HOB elevated Sit to supine: Modified independent  (Device/Increase time);HOB elevated   General bed mobility comments: With HOB elevated, pt able to supine <>sit with use of rail and MOD I  Transfers Overall transfer level: Needs assistance Equipment used: Rolling walker (2 wheeled) Transfers: Sit to/from Stand Sit to Stand: Supervision         General transfer comment: S from bed  Ambulation/Gait Ambulation/Gait assistance: Min guard Ambulation Distance (Feet): 200 Feet Assistive device: Rolling walker (2 wheeled) Gait Pattern/deviations: Step-through pattern;Trunk flexed Gait velocity: WNL   General Gait Details: Cues to stay within RW. In room, cueing to keep RW with him. Was able to side step without UE A.   Balance     Sitting balance-Leahy Scale: Good       Standing balance-Leahy Scale: Fair                         Zubrod Score: At the time of surgery this patient's most appropriate activity status/level should be described as: '[]'     0    Normal activity, no symptoms '[]'     1    Restricted in physical strenuous activity but ambulatory, able to do out light work '[]'     2    Ambulatory and capable of self care, unable to do work activities, up and about                 more than 50%  Of the time                            [  x]    3    Only limited self care, in bed greater than 50% of waking hours '[]'     4    Completely disabled, no self care, confined to bed or chair '[]'     5    Moribund  Past Medical History  Diagnosis Date  . Hypertension   . Hypothyroidism   . Graves disease     History reviewed. No pertinent past surgical history.  History  Smoking status  . Former Smoker  . Quit date: 12/06/1984  Smokeless tobacco  . Not on file    History  Alcohol Use: Not on file    History   Social History  . Marital Status: Single    Spouse Name: N/A  . Number of Children: N/A  . Years of Education: N/A   Occupational History  .    Social History Main Topics  . Smoking status: Former  Smoker    Quit date: 12/06/1984  . Smokeless tobacco: Not on file  . Alcohol Use: Not on file  . Drug Use: Not on file  . Sexual Activity: Not on file            No Known Allergies  Current Facility-Administered Medications  Medication Dose Route Frequency Provider Last Rate Last Dose  . 0.9 %  sodium chloride infusion  250 mL Intravenous PRN Rhonda G Barrett, PA-C      . 0.9 %  sodium chloride infusion  1 mL/kg/hr Intravenous Continuous Evelene Croon Barrett, PA-C 105.8 mL/hr at 01/28/15 0616 1 mL/kg/hr at 01/28/15 0616  . [MAR Hold] aspirin chewable tablet 81 mg  81 mg Oral Daily Larey Dresser, MD   81 mg at 01/27/15 7867  . [MAR Hold] atorvastatin (LIPITOR) tablet 80 mg  80 mg Oral q1800 Larey Dresser, MD   80 mg at 01/27/15 1746  . [MAR Hold] insulin aspart (novoLOG) injection 0-20 Units  0-20 Units Subcutaneous TID WC Cordelia Poche, MD   7 Units at 01/28/15 0840  . [MAR Hold] insulin aspart protamine- aspart (NOVOLOG MIX 70/30) injection 35 Units  35 Units Subcutaneous BID WC Ashly M Gottschalk, DO   35 Units at 01/27/15 1743  . [MAR Hold] levothyroxine (SYNTHROID, LEVOTHROID) tablet 150 mcg  150 mcg Oral QAC breakfast Cordelia Poche, MD   150 mcg at 01/28/15 0840  . [MAR Hold] metoprolol tartrate (LOPRESSOR) tablet 25 mg  25 mg Oral 3 times per day Larey Dresser, MD   25 mg at 01/28/15 0557  . [MAR Hold] ondansetron (ZOFRAN) injection 4 mg  4 mg Intravenous Q6H PRN Chesley Mires, MD      . Doug Sou Hold] senna (SENOKOT) tablet 8.6 mg  1 tablet Oral Daily Elberta Leatherwood, MD   8.6 mg at 01/28/15 1040  . sodium chloride 0.9 % injection 3 mL  3 mL Intravenous Q12H Rhonda G Barrett, PA-C   3 mL at 01/28/15 1000  . sodium chloride 0.9 % injection 3 mL  3 mL Intravenous PRN Lonn Georgia, PA-C        Prescriptions prior to admission  Medication Sig Dispense Refill Last Dose  . aspirin 81 MG tablet Take 81 mg by mouth daily.   Unknown  . atenolol (TENORMIN) 50 MG tablet Take 50 mg by mouth  daily.   Unknown  . furosemide (LASIX) 20 MG tablet Take 20 mg by mouth daily.   Unknown  . levothyroxine (SYNTHROID, LEVOTHROID) 150 MCG tablet Take 150 mcg by  mouth daily before breakfast.   Unknown  . lisinopril-hydrochlorothiazide (PRINZIDE,ZESTORETIC) 10-12.5 MG per tablet Take 1 tablet by mouth daily.   Unknown    Family History  Problem Relation Age of Onset  . Diabetes Mother   . Diabetes Sister   . Coronary artery disease Mother   . Dementia Father      Review of Systems:  Pertinent items are noted in HPI.     Cardiac Review of Systems: Y or N  Chest Pain [ n   ]  Resting SOB [ n  ] Exertional SOB  [  y]  Orthopnea [ n ]   Pedal Edema Blue.Reese   ]    Palpitations Florencio.Farrier  ] Syncope  Florencio.Farrier  ]   Presyncope [  n ]  General Review of Systems: [Y] = yes [  ]=no Constitional: recent weight change [  ]; anorexia [  ]; fatigue [  ]; nausea [  ]; night sweats [  ]; fever [  ]; or chills [  ]                                                               Dental: poor dentition[y  ]; Last Dentist visit:   Eye : blurred vision [  ]; diplopia [   ]; vision changes [  ];  Amaurosis fugax[  ]; Resp: cough [  ];  wheezing[  ];  hemoptysis[  ]; shortness of breath[ y ]; paroxysmal nocturnal dyspnea[  ]; dyspnea on exertion[y  ]; or orthopnea[  ];  GI:  gallstones[  ], vomiting[  ];  dysphagia[  ]; melena[  ];  hematochezia [  ]; heartburn[  ];   Hx of  Colonoscopy[  ]; GU: kidney stones [  ]; hematuria[  ];   dysuria [  ];  nocturia[  ];  history of     obstruction [  ]; urinary frequency [  ]             Skin: rash, swelling[  ];, hair loss[  ];  peripheral edema[ y ];  or itching[  ]; Musculosketetal: myalgias[  ];  joint swelling[  ];  joint erythema[  ];  joint pain[  ];  back pain[  ];  Heme/Lymph: bruising[  ];  bleeding[  ];  anemia[  ];  Neuro: TIA[  ];  headaches[  ];  stroke[  ];  vertigo[  ];  seizures[  ];   paresthesias[  ];  difficulty walking[  ];  Psych:depression[  ]; anxiety[   ];  Endocrine: diabetes[y  ];  thyroid dysfunction[ y ];  Immunizations: Flu [?  ]; Pneumococcal[?  ];  Other:  Physical Exam: BP 132/81 mmHg  Pulse 71  Temp(Src) 98.3 F (36.8 C) (Oral)  Resp 18  Ht 6' (1.829 m)  Wt 221 lb 1.9 oz (100.3 kg)  BMI 29.98 kg/m2  SpO2 98%   General appearance: alert, cooperative and no distress Head: Normocephalic, without obvious abnormality, atraumatic Neck: no adenopathy, no carotid bruit, no JVD and supple, symmetrical, trachea midline Lymph nodes: Cervical, supraclavicular, and axillary nodes normal. Resp: clear to auscultation bilaterally Back: symmetric, no curvature. ROM normal. No CVA tenderness. Cardio: regular rate and rhythm, S1, S2 normal, no murmur, click,  rub or gallop GI: soft, non-tender; bowel sounds normal; no masses,  no organomegaly Extremities: edema bilat LE's , come venous stasis changes/ skin changes of pretibial myxedema and DP/PT pulses present Neurologic: Mental status: Alert, oriented, thought content appropriate, currently Dentition: poor with necrotic teeth    Diagnostic Studies & Laboratory data:     Recent Radiology Findings:  Ct Head Wo Contrast  01/21/2015   CLINICAL DATA:  Initial evaluation of 64 year old male with altered mental status.  EXAM: CT HEAD WITHOUT CONTRAST  TECHNIQUE: Contiguous axial images were obtained from the base of the skull through the vertex without intravenous contrast.  COMPARISON:  No priors.  FINDINGS: Patchy and confluent areas of decreased attenuation are noted throughout the deep and periventricular white matter of the cerebral hemispheres bilaterally, compatible with chronic microvascular ischemic disease. No acute intracranial abnormalities. Specifically, no evidence of acute intracranial hemorrhage, no definite findings of acute/subacute cerebral ischemia, no mass, mass effect, hydrocephalus or abnormal intra or extra-axial fluid collections. Visualized paranasal sinuses and mastoids  are well pneumatized. No acute displaced skull fractures are identified.  IMPRESSION: 1. No acute intracranial abnormalities. 2. Mild chronic microvascular ischemic changes in the cerebral white matter.   Electronically Signed   By: Vinnie Langton M.D.   On: 01/21/2015 11:59   Mr Jodene Nam Head Wo Contrast  01/22/2015   CLINICAL DATA:  Initial evaluation for altered mental status with left-sided weakness.  EXAM: MRI HEAD WITHOUT CONTRAST  MRA HEAD WITHOUT CONTRAST  TECHNIQUE: Multiplanar, multiecho pulse sequences of the brain and surrounding structures were obtained without intravenous contrast. Angiographic images of the head were obtained using MRA technique without contrast.  COMPARISON:  Prior CT from 01/21/2015.  FINDINGS: MRI HEAD FINDINGS  Study is degraded by motion artifact.  Mild diffuse prominence of the CSF containing spaces compatible with generalized cerebral atrophy. Mild confluent T2 S FLAIR hyperintensity within the periventricular white matter noted, nonspecific, but likely related to mild chronic small vessel ischemic changes.  No abnormal foci of restricted diffusion to suggest acute intracranial infarct. Gray-white matter differentiation maintained. No acute intracranial hemorrhage. Normal intravascular flow voids are present.  No mass lesion or midline shift. No mass effect. No hydrocephalus. No extra-axial fluid collection.  Craniocervical junction within normal limits. Pituitary gland normal. No acute abnormality seen about the orbits.  Mild mucoperiosteal thickening present within the ethmoidal air cells and maxillary sinuses. No air-fluid levels to suggest active sinus infection. No mastoid effusion.  Bone marrow signal intensity is normal. No acute abnormality seen within the scalp soft tissues.  MRA HEAD FINDINGS  ANTERIOR CIRCULATION:  The visualized distal cervical segments of the internal carotid arteries are widely patent with antegrade flow. The petrous segments widely patent. Multi  focal atheromatous irregularity present within the cavernous segments of the internal carotid arteries bilaterally, greater on the left. There is a short-segment moderate stenosis within the anterior genu of the cavernous left ICA (series 6, image 93). There is no more mild to moderate stenosis within the proximal aspect of the cavernous right ICA (series 6, image 83). Supraclinoid segments widely patent.  A1 segments, anterior communicating artery, and anterior cerebral arteries well opacified.  Right M1 segment well opacified without flow-limiting stenosis or occlusion. Right MCA bifurcation normal. Mild distal branch irregularity present within the right MCA branches.  The left M1 segment bifurcates early. There is a superimposed short-segment moderate stenosis within the left M1 segment just proximal to the bifurcation (series 6, image 102). Distal branch atherosclerotic irregularity  seen distally within the left MCA branches.  POSTERIOR CIRCULATION:  Left vertebral artery is dominant and widely patent to the vertebrobasilar junction. There is a short segment mild stenosis within the distal left vertebral artery just proximal to the vertebrobasilar junction.  The diminutive right vertebral artery is markedly irregular with attenuated flow and multi focal moderate to severe stenoses. The distal right vertebral artery may be partially excluded Prior to the vertebrobasilar junction.  The posterior inferior cerebral arteries are patent proximally.  Multi focal atheromatous irregularity present within the proximal basilar artery without significant stenosis. Superior cerebellar arteries are patent proximally. There appears to be atheromatous irregularity distally within the SCAs. P1 and P2 segments well opacified bilaterally. There is distal branch atheromatous irregularity within the PCAs bilaterally.  No aneurysm or vascular malformation.  IMPRESSION: MRI HEAD IMPRESSION:  1. No acute intracranial infarct or other  abnormality identified. 2. Generalized atrophy with mild chronic small vessel ischemic disease. 3. Please note that and MRV was not performed on this exam due to motion artifact and patient's inability to tolerate the length of the exam.  MRA HEAD IMPRESSION:  1. No proximal branch occlusion identified within the intracranial circulation. 2. Short-segment focal moderate stenoses within the cavernous ICAs and left M1 segment. 3. Heavy atheromatous disease within the right vertebral artery with secondary multi focal irregularity and attenuated flow related enhancement. 4. Widely patent left vertebral artery. 5. Mild to moderate atheromatous irregularity within the proximal basilar artery without flow-limiting stenosis. 6. Distal branch atheromatous irregularity within the MCA and PCA branches bilaterally.   Electronically Signed   By: Jeannine Boga M.D.   On: 01/22/2015 04:47     I have independently reviewed the above radiologic studies.  Recent Lab Findings: Lab Results  Component Value Date   WBC 7.0 01/28/2015   HGB 10.0* 01/28/2015   HCT 29.7* 01/28/2015   PLT 161 01/28/2015   GLUCOSE 227* 01/28/2015   CHOL 251* 01/21/2015   TRIG 418* 01/21/2015   HDL 25* 01/21/2015   LDLCALC UNABLE TO CALCULATE IF TRIGLYCERIDE OVER 400 mg/dL 01/21/2015   ALT 37 01/22/2015   AST 125* 01/22/2015   NA 141 01/28/2015   K 3.5 01/28/2015   CL 112 01/28/2015   CREATININE 1.88* 01/28/2015   BUN 22 01/28/2015   CO2 24 01/28/2015   TSH 2.144 01/20/2015   INR 1.16 01/28/2015   HGBA1C 13.7* 01/20/2015    CARDIAC CATHETERIZATION REPORT  NAME: Johnmark TKZSWFUX:[id number] DOB: [birth year]ADMIT DATE: 01/20/2015 Procedure Date: 01/28/2015  INTERVENTIONAL CARDIOLOGIST: Leonie Man, M.D., MS PRIMARY CARE PROVIDER: No primary care provider on file. PRIMARY CARDIOLOGIST: New to [facility]; Initial consultation was by Dr. Aundra Dubin;  has been seen by Dr. Daneen Schick and Dr. Angelena Form  PATIENT: Milbern Doescher is a 64 y.o. male with prior history of hypertension and hypothyroidism who was admitted on February 15 with lethargy and bowel/bladder incontinence. He is found to have DKA with a glucose of 14,000 with acute renal insufficiency and altered mental status. Reactine level has improved from 6.12 down to 1.8. His troponin peaked at greater than 30 consistent with probable non-STEMI. He was managed medically and stabilized until renal function was stable. He is now referred for diagnostic Left Heart Catheterization/Coronary Angiography. Plan is for staged PCI if indicated.  PRE-OPERATIVE DIAGNOSIS:   Non-ST elevation MI  Diabetic Ketoacidosis/HONK  PROCEDURES PERFORMED:   Left Heart Catheterization with Native Coronary Angiography via RIGHT RADIAL Artery  PROCEDURE: The patient was brought to the 2nd Floor  [facility] in the fasting state and prepped and draped in the usual sterile fashion for RIGHT RADIAL artery access. A modified Allen's test was performed on the right wrist demonstrating excellent collateral flow for radial access. Sterile technique was used including antiseptics, cap, gloves, gown, hand hygiene, mask and sheet. Skin prep: Chlorhexidine.   Consent: Risks of procedure as well as the alternatives and risks of each were explained to the (patient/caregiver). Consent for procedure obtained.   Time Out: Verified patient identification, verified procedure, site/side was marked, verified correct patient position, special equipment/implants available, medications/allergies/relevent history reviewed, required imaging and test results available. Performed.  Access:   Right Radial Artery: 6 Fr Sheath - Seldinger Technique (Angiocath Micropuncture Kit)  Radial Cocktail - 10 mL; IV Heparin 5000 Units   Left Heart Catheterization: 5 Fr TIG 4.0 Catheter advanced and  exchanged over a long exchange safety J-wire; catheter advancement was performed over the wire and under fluoroscopic guidance.   Left Coronary Artery Cineangiography: TIG 4.0 Catheter   LV Hemodynamics (LV Gram): TIG 4.0 Catheter  Sheath removed in the cardiac catheterization laboratory TR band placement for hemostasis.  TR Band: 1325 Hours; 15 mL air  FINDINGS:  Hemodynamics:   Central Aortic Pressure / Mean: 114/69/88 mmHg  Left Ventricular Pressure / LVEDP: 114/7/18 mmHg  Left Ventriculography: Deferred to conserve contrast  Coronary Anatomy:  Dominance: Right  Left Main: Long large-caliber vessel with mild luminal irregularities. The bifurcation is very distal with a diminutive circumflex and large LAD. LAD: Large-caliber wraparound vessel that perfuses the distal third of the inferoapex. There is a ostial/proximal tubular eccentric 80% stenosis going from the circumflex to D1. Beyond D1 there is a focal 90-95% stenosis followed by a relatively normal segment with mild prestenotic dilatation before a 40% stenosis. The vessel then is relatively free of disease until the distal/apical region where there is a roughly 70% tubular stenosis.  D1: Large-caliber vessel with mid 40% stenosis prior to bifurcating into one large and one medium-size branch. No significant disease.  Left Circumflex: Very small caliber, diminutive vessel that a sleep courses of this all OM branch. There is diffuse 80% stenosis. The distal vessel does not perfuse much of the myocardium.    RCA: Large-caliber, likely dominant vessel that is 100% occluded in the mid vessel at the takeoff of a normally marginal branch. There does appear to be a branch coming from the near ostial segment that courses over to the circumflex distribution, suggesting a possible anomalous branch to the circumflex. It is a small caliber vessel.  There is evidence of left to right collaterals from the septal perforators from  the LAD to the PDA backfilling what appears to be a relatively large, dominant RCA.  MEDICATIONS:  Anesthesia: Local Lidocaine 2 ml  Sedation: 1 mg IV Versed,   Omnipaque Contrast: 60 ml; very large diameter vessel required 6-8 mL of contrast to fill the vessel.  Anticoagulation: IV Heparin 5000 Units ;  Radial Cocktail: 5 mg Verapamil, 400 mcg NTG, 2 ml 2% Lidocaine in 10 ml NS  PATIENT DISPOSITION:   The patient was transferred to the PACU holding area in a hemodynamicaly stable, chest pain free condition.  The patient tolerated the procedure well, and there were no complications. EBL: < 5 ml  The patient was stable before, during, and after the procedure.  POST-OPERATIVE DIAGNOSIS:   Severe multivessel disease with a 100% occluded RCA and tandem 80-90% lesions in the ostial/proximal and mid LAD.  The ostial LAD lesion compromises a very large diagonal branch. There is also severe disease in the small diminutive circumflex.  Echocardiographic evidence of normal/preserved cardiac function. Mild-Moderately elevated LVEDP  PLAN OF CARE:  Standard post radial cath care.  The patient will be hydrated for his renal insufficiency.  CT surgical consultation has been called. If CABG is felt to be not indicated, he would probably require staged PCI of first the LAD and the possible intervention on the occluded RCA.  Continue aggressive risk factor modification.    I have independently reviewed the above  cath films and reviewed the findings with the  patient .   Echocardiogram 01/22/2015.               *[facility]*            [place], [place] [address]              [phone]  ------------------------------------------------------------------- Transthoracic Echocardiography  Patient:  Draken, Farrior MR #:    [id number] Study Date:  01/22/2015 Gender:   M Age:    41 Height:   182.9 cm Weight:   105 kg BSA:    2.34 m^2 Pt. Status: Room:    [facility] ADMITTING  Chesley Mires [id number] ATTENDING  Glenice Bow REFERRING  Kara Mead V PERFORMING  [facility], Inpatient  cc:  ------------------------------------------------------------------- LV EF: 65% -  70%  ------------------------------------------------------------------- Indications:   MI - acute 410.91.  ------------------------------------------------------------------- History:  PMH:  Altered mental status. Risk factors: Diabetes mellitus.  ------------------------------------------------------------------- Study Conclusions  - Left ventricle: The cavity size was normal. There was mild concentric hypertrophy. Systolic function was vigorous. The estimated ejection fraction was in the range of 65% to 70%. There was no dynamic obstruction. Although no diagnostic regional wall motion abnormality was identified, this possibility cannot be completely excluded on the basis of this study. Doppler parameters are consistent with abnormal left ventricular relaxation (grade 1 diastolic dysfunction). - Mitral valve: Calcified annulus.  Transthoracic echocardiography. M-mode, complete 2D, spectral Doppler, and color Doppler. Birthdate: Patient birthdate: [birth year]. Age: Patient is 64 yr old. Sex: Gender: male. BMI: 31.4 kg/m^2. Blood pressure:   125/80 Patient status: Inpatient. Study date: Study date: 01/22/2015. Study time: 02:14 PM. Location: ICU/CCU  -------------------------------------------------------------------  ------------------------------------------------------------------- Left ventricle: The cavity size was normal. There was mild concentric hypertrophy. Systolic function was vigorous. The estimated ejection fraction was in  the range of 65% to 70%. There was no dynamic obstruction. Although no diagnostic regional wall motion abnormality was identified, this possibility cannot be completely excluded on the basis of this study. Doppler parameters are consistent with abnormal left ventricular relaxation (grade 1 diastolic dysfunction).  ------------------------------------------------------------------- Aortic valve:  Trileaflet; mildly thickened, mildly calcified leaflets. Mobility was not restricted. Doppler: Transvalvular velocity was within the normal range. There was no stenosis. There was no regurgitation.  ------------------------------------------------------------------- Aorta: Aortic root: The aortic root was normal in size.  ------------------------------------------------------------------- Mitral valve: Poorly visualized. Calcified annulus. Mobility was not restricted. Doppler: Transvalvular velocity was within the normal range. There was no evidence for stenosis. There was no regurgitation.  ------------------------------------------------------------------- Left atrium: The atrium was normal in size.  ------------------------------------------------------------------- Right ventricle: The cavity size was normal. Wall thickness was normal. Systolic function was normal.  ------------------------------------------------------------------- Pulmonic valve:  Poorly visualized.  Structurally normal valve. Cusp separation was normal. Doppler: Transvalvular velocity was within the normal range. There was no evidence for stenosis. There was no regurgitation.  ------------------------------------------------------------------- Tricuspid valve:  Structurally normal valve.  Doppler: Transvalvular velocity was within the normal range. There was no regurgitation.  ------------------------------------------------------------------- Pulmonary artery:  Poorly visualized. The main  pulmonary artery was normal-sized. Systolic pressure was within the normal range.  ------------------------------------------------------------------- Right atrium: The atrium was normal in size.  ------------------------------------------------------------------- Pericardium: There was no pericardial effusion.  ------------------------------------------------------------------- Systemic veins: Inferior vena cava: The vessel was normal in size.  ------------------------------------------------------------------- Measurements  Left ventricle              Value    Reference LV ID, ED, PLAX chordal    (L)    32.8 mm   43 - 52 LV ID, ES, PLAX chordal    (L)    22.5 mm   23 - 38 LV fx shortening, PLAX chordal      31  %   >=29 LV PW thickness, ED           15.2 mm   --------- IVS/LV PW ratio, ED           0.84     <=1.3 LV e&', lateral              6.22 cm/s  --------- LV E/e&', lateral             7.94     --------- LV e&', medial              4.86 cm/s  --------- LV E/e&', medial             10.16    --------- LV e&', average              5.54 cm/s  --------- LV E/e&', average             8.92     ---------  Ventricular septum            Value    Reference IVS thickness, ED            12.7 mm   ---------  Aorta                  Value    Reference Aortic root ID, ED            30  mm   ---------  Left atrium               Value    Reference LA ID, A-P, ES              21  mm   --------- LA ID/bsa, A-P              0.9  cm/m^2 <=2.2 LA volume, S               10  ml   --------- LA volume/bsa, S             4.3  ml/m^2 --------- LA volume,  ES, 1-p A4C          12  ml   --------- LA volume/bsa, ES, 1-p A4C        5.1  ml/m^2 --------- LA volume, ES, 1-p A2C          7   ml   --------- LA volume/bsa, ES, 1-p A2C        3  ml/m^2 ---------  Mitral valve               Value    Reference Mitral E-wave peak velocity       49.4 cm/s  --------- Mitral A-wave peak velocity       64.7 cm/s  --------- Mitral deceleration time    (H)    264  ms   150 - 230 Mitral E/A ratio, peak          0.7     ---------  Right ventricle             Value    Reference RV s&', lateral, S            13.4 cm/s  ---------  Legend: (L) and (H) mark values outside specified reference range.  ------------------------------------------------------------------- Prepared and Electronically Authenticated by  Candee Furbish, M.D. 2016-02-17T17:06:57  Chronic Kidney Disease   Stage I     GFR >90  Stage II    GFR 60-89  Stage IIIA GFR 45-59  Stage IIIB GFR 30-44  Stage IV   GFR 15-29  Stage V    GFR  <15  Lab Results  Component Value Date   CREATININE 1.88* 01/28/2015   Estimated Creatinine Clearance: 49.3 mL/min (by C-G formula based on Cr of 1.88).   Assessment / Plan  Severe CAD, as described per cath report. We will have to determine best timing of CABG  d/t recent acute illness as he may benefit from further rehabilitation prior to proceeding.  Renal insufficiency- Stage IIIa CKD Encephalopathy - improving, but still significant generalized weakness DKA- Sugars under much better control but stil high 200"s today Pancreatitis- most recent Lipase 313 on 01/22/15     I agree with proceeding with CABG, but I think the patient would benefit with further period of recovery following his acte presentation. Consider close medical therpy to stablize his DM care and renal function before proceed. Patient at  this point is "scared" to have surgery and not sure if he wants to proceed. I good  follow up outpatient care can be insured, could consider CABG in 3-4 weeks depending on how he is doing. Will follow with you.    Grace Isaac MD      [address] [place] [address] Office [phone]   Beeper [phone]  01/28/2015 6:24 PM

## 2015-01-28 NOTE — Progress Notes (Signed)
Family Medicine Teaching Service Daily Progress Note Intern Pager: [phone]  Patient name: Dave LevinsWilliam Cervantes Medical record number: [id number] Date of birth: 02/05/1951 Age: 64 y.o. Gender: male  Primary Care Provider: No primary care provider on file. Consultants: Neurology, Cardiology Code Status: FULL  Pt Overview and Major Events to Date:  2/16: NSTEMI- elevated troponin with new inverted t-wave in anterior leads 2/17: DKA resolved, lantus started; Troponin trending down 2/18: AMS improved, but not resolved; Na of 160; transfer to tele 2/19: transferred to FPTS, heparin gtt stopped 2/23: cardiac cath this pm.  Assessment and Plan: 64 year old male [facility] with AMS x 2 days. In ED was lethargic and hyperglycemic (Glu 1400) with positive urine ketones. PCCM called for admission.  Patient tranferred to FMTS on 01/24/15.   #NSTEMI: Troponins trending down 2.94>>0.68. Echo shows no wall motion abnormalities and preserved EF. -c/s Cardiology: risk factor management, BB and LA nitrates as BP tolerates, Coronary angio when renal function allows, will cath when appropriate: cath later today -Heparin gtt stopped 2/19 because of low platelets <95 -ASA 81  #Thrombocytopenia:  95> 161 this morning.   -Heparin gtt dc'd 2/19 -continue to monitor with CBC daily -HIT panel pending  #DKA: CBG >1400 on admission.  A1c 13.7. CBGs 173-202/24 hours.  -c/s diabetic coordinator, appreciate recs -Will contact DM coordinator upon discharge for assistance with conversion to NovoLIN 70/30 upon discharge -Continue Novolog 70/30: 35 u BID while in hospital.  #Acute metabolic encephalopathy: CT head 2/16 no acute abnormalities.  MRI brain no acute processes.  MRA: as outlined below.  Ammonia 25.  No obvious home medications that would cause AMS.  EEG with nonspecific moderate encephalopathic changes.  Mental status improving.   -Neurology signed off on 01/23/15  #HTN: BP 130/89 this am -Continue  lopressor 25 TID -f/u cards recs -Consider ACE-I low dose once AKI resolved   #ARF: Improving. Cr 1.88 today. (Baseline 2015 Cr 1.5) -Daily BMET -Encourage PO fluids  #Hypernatremia: Resolved.  Na 142 today. FeNa 0.49% on 2/15, suggests pre-renal. -SLIV -Daily BMET  #hypokalemia: K 3.5 this am -Daily BMET -Replete PRN  #acute pancreatitis: Resolved. Lipase 2490 on admission, 313 on 2/17.  #hypothyroidism: On synthroid 150mcg at home.  TSH 2.144 -recheck in 6 weeks  #Constipation: -Senna PO -Dulcolax supp x1  FEN/GI: SLIV, Clears today for cath. PPx: SCDs  Disposition: HH once medically stable.  Subjective: Patient reports that he is doing well this am.  He reports ambulating well down the halls.  Denies CP, SOB, Dizziness, n/v/d.  Reports feeling gaseous.      Objective: Temp:  [97.6 F (36.4 C)-98.6 F (37 C)] 98.3 F (36.8 C) (02/23 0518) Pulse Rate:  [73-80] 73 (02/23 0518) Resp:  [12-18] 18 (02/23 0518) BP: (110-131)/(69-89) 130/89 mmHg (02/23 0518) SpO2:  [95 %-100 %] 98 % (02/23 0518) Weight:  [221 lb 1.9 oz (100.3 kg)] 221 lb 1.9 oz (100.3 kg) (02/23 0554)   I/O last 3 completed shifts: In: 924 [P.O.:924] Out: 2760 [Urine:2760] Total I/O In: -  Out: 250 [Urine:250] Physical Exam: General: awake, alert, NAD, sitting up in bed HENT: [place]/AT, EOMI, poor dentition Cardiovascular: RRR, no m/r/g Respiratory: CTAB anteriorly, no increased WOB Abdomen: obese, soft, NT/ND, +BS Extremities: WWP, no cyanosis, clubbing or edema. Neuro:  AAOx3, speech much improved, follows commands, 5/5 LE and UE strength Skin: healing excoriations on b/l shins  Laboratory:  Recent Labs Lab 01/26/15 0505 01/27/15 0848 01/28/15 0549  WBC 7.6  8.5 7.0  HGB 11.2* 12.4* 10.0*  HCT 33.9* 37.4* 29.7*  PLT 99* 146* 161    Recent Labs Lab 01/22/15 0314  01/26/15 0505 01/27/15 0848 01/28/15 0549  NA 160*  < > 142 139 141  K 3.6  < > 3.3* 3.3* 3.5  CL >130*  < > 111 109  112  CO2 23  < > BUN 67*  < > 31* 25* 22  CREATININE 3.03*  < > 1.91* 1.88* 1.88*  CALCIUM 8.2*  < > 8.2* 8.5 8.2*  PROT 6.3  --   --   --   --   BILITOT 1.0  --   --   --   --   ALKPHOS 76  --   --   --   --   ALT 37  --   --   --   --   AST 125*  --   --   --   --   GLUCOSE 333*  < > 237* 194* 227*  < > = values in this interval not displayed.  Cardiac Panel (last 3 results)  Recent Labs  01/25/15 1142 01/25/15 1722  TROPONINI 0.86* 0.68*   CBG (last 3)   Recent Labs  01/27/15 1659 01/27/15 2111 01/28/15 0812  GLUCAP 181* 219* 202*    Imaging/Diagnostic Tests: No new imaging  Raliegh Ip, DO 01/28/2015, 9:54 AM PGY-1, [facility] Family Medicine FPTS Intern pager: [phone], text pages welcome

## 2015-01-28 NOTE — Interval H&P Note (Signed)
History and Physical Interval Note:  01/28/2015 12:33 PM  Corwin LevinsWilliam Caulder  has presented today for surgery, with the diagnosis of NSTEMI.  The various methods of treatment have been discussed with the patient and family. After consideration of risks, benefits and other options for treatment, the patient has consented to  Procedure(s): LEFT HEART CATHETERIZATION WITH CORONARY ANGIOGRAM (N/A) as a surgical intervention .  The patient's history has been reviewed, patient examined, no change in status, stable for surgery.  I have reviewed the patient's chart and labs.  Questions were answered to the patient's satisfaction.     Ayala Ribble W  CAD Assessment (Coronary Angiography With or Without Left Heart Catheterization and/or Left Ventriculography)  Patient Information:  Suspected ACS, Intermediate Risk  AUC Score:   A (8)   Indication:   4

## 2015-01-28 NOTE — Progress Notes (Signed)
Additional 3cc's removed from TR Band

## 2015-01-28 NOTE — CV Procedure (Signed)
CARDIAC CATHETERIZATION REPORT  NAME:  Dave Cervantes   MRN: [id number] DOB:  [birth year]   ADMIT DATE: 01/20/2015 Procedure Date: 01/28/2015  INTERVENTIONAL CARDIOLOGIST: Leonie Man, M.D., MS PRIMARY CARE PROVIDER: No primary care provider on file. PRIMARY CARDIOLOGIST: New to [facility]; Initial consultation was by Dr. Aundra Dubin; has been seen by Dr. Daneen Schick and Dr. Angelena Form  PATIENT:  Dave Cervantes is a 64 y.o. male with prior history of hypertension and hypothyroidism who was admitted on February 15 with lethargy and bowel/bladder incontinence. He is found to have DKA with a glucose of 14,000 with acute renal insufficiency and altered mental status. Reactine level has improved from 6.12 down to 1.8. His troponin peaked at greater than 30 consistent with probable non-STEMI. He was managed medically and stabilized until renal function was stable. He is now referred for diagnostic Left Heart Catheterization/Coronary Angiography. Plan is for staged PCI if indicated.  PRE-OPERATIVE DIAGNOSIS:    Non-ST elevation MI  Diabetic Ketoacidosis/HONK  PROCEDURES PERFORMED:    Left Heart Catheterization with Native Coronary Angiography via RIGHT RADIAL Artery   PROCEDURE: The patient was brought to the 2nd [place] Cardiac Catheterization Lab in the fasting state and prepped and draped in the usual sterile fashion for RIGHT RADIAL artery access. A modified Allen's test was performed on the right wrist demonstrating excellent collateral flow for radial access.   Sterile technique was used including antiseptics, cap, gloves, gown, hand hygiene, mask and sheet. Skin prep: Chlorhexidine.   Consent: Risks of procedure as well as the alternatives and risks of each were explained to the (patient/caregiver). Consent for procedure obtained.   Time Out: Verified patient identification, verified procedure, site/side was marked, verified correct patient position, special equipment/implants  available, medications/allergies/relevent history reviewed, required imaging and test results available. Performed.  Access:   Right Radial Artery: 6 Fr Sheath -  Seldinger Technique (Angiocath Micropuncture Kit)  Radial Cocktail - 10 mL; IV Heparin 5000 Units   Left Heart Catheterization: 5 Fr TIG 4.0 Catheter advanced and exchanged over a long exchange safety J-wire;  catheter advancement was performed over the wire and under fluoroscopic guidance.   Left Coronary Artery Cineangiography: TIG 4.0 Catheter   LV Hemodynamics (LV Gram): TIG 4.0 Catheter  Sheath removed in the cardiac catheterization laboratory TR band placement for hemostasis.  TR Band: 1325  Hours; 15 mL air  FINDINGS:  Hemodynamics:   Central Aortic Pressure / Mean: 114/69/88 mmHg  Left Ventricular Pressure / LVEDP: 114/7/18 mmHg  Left Ventriculography: Deferred to conserve contrast  Coronary Anatomy:  Dominance: Right  Left Main: Long large-caliber vessel with mild luminal irregularities. The bifurcation is very distal with a diminutive circumflex and large LAD. LAD: Large-caliber wraparound vessel that perfuses the distal third of the inferoapex. There is a ostial/proximal tubular eccentric 80% stenosis going from the circumflex to D1. Beyond D1 there is a focal 90-95% stenosis followed by a relatively normal segment with mild prestenotic dilatation before a 40% stenosis. The vessel then is relatively free of disease until the distal/apical region where there is a roughly 70% tubular stenosis.  D1: Large-caliber vessel with mid 40% stenosis prior to bifurcating into one large and one medium-size branch. No significant disease.  Left Circumflex: Very small caliber, diminutive vessel that a sleep courses of this all OM branch. There is diffuse 80% stenosis. The distal vessel does not perfuse much of the myocardium.    RCA: Large-caliber, likely dominant vessel that is 100% occluded in the mid  vessel at the  takeoff of a normally marginal branch. There does appear to be a branch coming from the near ostial segment that courses over to the circumflex distribution, suggesting a possible anomalous branch to the circumflex. It is a small caliber vessel.  There is evidence of left to right collaterals from the septal perforators from the LAD to the PDA backfilling what appears to be a relatively large, dominant RCA.  MEDICATIONS:  Anesthesia:  Local Lidocaine 2 ml  Sedation:  1 mg IV Versed,    Omnipaque Contrast: 60 ml; very large diameter vessel required 6-8 mL of contrast to fill the vessel.  Anticoagulation:  IV Heparin 5000 Units ; Radial Cocktail: 5 mg Verapamil, 400 mcg NTG, 2 ml 2% Lidocaine in 10 ml NS  PATIENT DISPOSITION:    The patient was transferred to the PACU holding area in a hemodynamicaly stable, chest pain free condition.  The patient tolerated the procedure well, and there were no complications.  EBL:   < 5 ml  The patient was stable before, during, and after the procedure.  POST-OPERATIVE DIAGNOSIS:    Severe multivessel disease with a 100% occluded RCA and tandem 80-90% lesions in the ostial/proximal and mid LAD. The ostial LAD lesion compromises a very large diagonal branch. There is also severe disease in the small diminutive circumflex.  Echocardiographic evidence of normal/preserved cardiac function. Mild-Moderately elevated LVEDP  PLAN OF CARE:  Standard post radial cath care.  The patient will be hydrated for his renal insufficiency.  CT surgical consultation has been called. If CABG is felt to be not indicated, he would probably require staged PCI of first the LAD and the possible intervention on the occluded RCA.  Continue aggressive risk factor modification.    Leonie Man, M.D., M.S. Interventional Cardiologist   Pager # [phone]

## 2015-01-28 NOTE — H&P (View-Only) (Signed)
     SUBJECTIVE: No chest pain or SOB.   BP 130/89 mmHg  Pulse 73  Temp(Src) 98.3 F (36.8 C) (Oral)  Resp 18  Ht 6' (1.829 m)  Wt 221 lb 1.9 oz (100.3 kg)  BMI 29.98 kg/m2  SpO2 98%  Intake/Output Summary (Last 24 hours) at 01/28/15 0754 Last data filed at 01/28/15 0700  Gross per 24 hour  Intake    444 ml  Output   1260 ml  Net   -816 ml    PHYSICAL EXAM General: Well developed, well nourished, in no acute distress. Alert and oriented x 3.  Psych:  Good affect, responds appropriately Neck: No JVD. No masses noted.  Lungs: Clear bilaterally with no wheezes or rhonci noted.  Heart: RRR with no murmurs noted. Abdomen: Bowel sounds are present. Soft, non-tender.  Extremities: No lower extremity edema.   LABS: Basic Metabolic Panel:  Recent Labs  16/09/9601/22/16 0848 01/28/15 0549  NA 139 141  K 3.3* 3.5  CL 109 112  CO2 23 24  GLUCOSE 194* 227*  BUN 25* 22  CREATININE 1.88* 1.88*  CALCIUM 8.5 8.2*  MG 2.3  --    CBC:  Recent Labs  01/27/15 0848 01/28/15 0549  WBC 8.5 7.0  HGB 12.4* 10.0*  HCT 37.4* 29.7*  MCV 96.4 95.8  PLT 146* 161   Cardiac Enzymes:  Recent Labs  01/25/15 1142 01/25/15 1722  TROPONINI 0.86* 0.68*    Current Meds: . aspirin  81 mg Oral Daily  . atorvastatin  80 mg Oral q1800  . insulin aspart  0-20 Units Subcutaneous TID WC  . insulin aspart protamine- aspart  35 Units Subcutaneous BID WC  . levothyroxine  150 mcg Oral QAC breakfast  . metoprolol tartrate  25 mg Oral 3 times per day  . potassium chloride  40 mEq Oral BID  . senna  1 tablet Oral Daily  . sodium chloride  3 mL Intravenous Q12H     ASSESSMENT AND PLAN:  1. NSTEMI: Pt admitted with DKA and had significant troponin elevation (over 32). Cardiac cath later today to define coronary anatomy. Coronaries only with renal insufficiency. He is on for 6th case which will not happen before 4pm. Will give clear liquid breakfast then NPO.   2. Acute on chronic renal  failure: Creatinine now stable at 1.88. Was as high as 6.0.   3. DM: per primary team    4. Hypokalemia: Replace K this am.   MCALHANY,CHRISTOPHER  2/23/20167:54 AM

## 2015-01-28 NOTE — Progress Notes (Signed)
cbg 206.  Pt voided 200 dark amber

## 2015-01-28 NOTE — Progress Notes (Signed)
OT Cancellation Note  Patient Details Name: Dave LevinsWilliam Cervantes MRN: [id number] DOB: 11-Sep-1951   Cancelled Treatment:    Reason Eval/Treat Not Completed: Fatigue/lethargy limiting ability to participate. Pt asleep when OT arrived and states he has already been up today. Not willing to work with OT at this time.  Earlie RavelingStraub, Rivaan Kendall L OTR/L [phone] 01/28/2015, 12:01 PM

## 2015-01-28 NOTE — Progress Notes (Signed)
Additional 3ccs removed from tr band

## 2015-01-29 ENCOUNTER — Inpatient Hospital Stay (HOSPITAL_COMMUNITY): Payer: Non-veteran care

## 2015-01-29 ENCOUNTER — Encounter (HOSPITAL_COMMUNITY): Payer: Non-veteran care

## 2015-01-29 DIAGNOSIS — I25760 Atherosclerosis of bypass graft of coronary artery of transplanted heart with unstable angina: Secondary | ICD-10-CM

## 2015-01-29 LAB — GLUCOSE, CAPILLARY
GLUCOSE-CAPILLARY: 231 mg/dL — AB (ref 70–99)
Glucose-Capillary: 233 mg/dL — ABNORMAL HIGH (ref 70–99)
Glucose-Capillary: 147 mg/dL — ABNORMAL HIGH (ref 70–99)

## 2015-01-29 LAB — PULMONARY FUNCTION TEST
FEF 25-75 Post: 3.07 L/sec
FEF 25-75 Pre: 3.33 L/sec
FEF2575-%Change-Post: -7 %
FEF2575-%Pred-Post: 107 %
FEF2575-%Pred-Pre: 116 %
FEV1-%Change-Post: 0 %
FEV1-%Pred-Post: 98 %
FEV1-%Pred-Pre: 98 %
FEV1-Post: 3.14 L
FEV1-Pre: 3.14 L
FEV1FVC-%Change-Post: -1 %
FEV1FVC-%Pred-Pre: 106 %
FEV6-%Change-Post: 2 %
FEV6-%Pred-Post: 97 %
FEV6-%Pred-Pre: 94 %
FEV6-Post: 3.84 L
FEV6-Pre: 3.74 L
FEV6FVC-%Change-Post: 0 %
FEV6FVC-%Pred-Post: 104 %
FEV6FVC-%Pred-Pre: 103 %
FVC-%Change-Post: 2 %
FVC-%Pred-Post: 93 %
FVC-%Pred-Pre: 92 %
FVC-Post: 3.85 L
FVC-Pre: 3.77 L
Post FEV1/FVC ratio: 82 %
Post FEV6/FVC ratio: 100 %
Pre FEV1/FVC ratio: 83 %
Pre FEV6/FVC Ratio: 99 %

## 2015-01-29 LAB — CBC
HCT: 30.4 % — ABNORMAL LOW (ref 39.0–52.0)
HEMOGLOBIN: 10 g/dL — AB (ref 13.0–17.0)
MCH: 31.7 pg (ref 26.0–34.0)
MCHC: 32.9 g/dL (ref 30.0–36.0)
MCV: 96.5 fL (ref 78.0–100.0)
Platelets: 186 10*3/uL (ref 150–400)
RBC: 3.15 MIL/uL — AB (ref 4.22–5.81)
RDW: 13.2 % (ref 11.5–15.5)
WBC: 7.1 10*3/uL (ref 4.0–10.5)

## 2015-01-29 LAB — BASIC METABOLIC PANEL
Anion gap: 5 (ref 5–15)
BUN: 16 mg/dL (ref 6–23)
CO2: 23 mmol/L (ref 19–32)
CREATININE: 1.79 mg/dL — AB (ref 0.50–1.35)
Calcium: 8 mg/dL — ABNORMAL LOW (ref 8.4–10.5)
Chloride: 111 mmol/L (ref 96–112)
GFR calc Af Amer: 45 mL/min — ABNORMAL LOW (ref >90)
GFR, EST NON AFRICAN AMERICAN: 39 mL/min — AB (ref >90)
Glucose, Bld: 270 mg/dL — ABNORMAL HIGH (ref 70–99)
Potassium: 3.8 mmol/L (ref 3.5–5.1)
Sodium: 139 mmol/L (ref 135–145)

## 2015-01-29 MED ORDER — INSULIN NPH ISOPHANE & REGULAR (70-30) 100 UNIT/ML ~~LOC~~ SUSP: 40.0000 [IU] | Freq: Two times a day (BID) | SUBCUTANEOUS | Status: DC

## 2015-01-29 MED ORDER — INSULIN ASPART PROT & ASPART (70-30 MIX) 100 UNIT/ML ~~LOC~~ SUSP
40.0000 [IU] | Freq: Two times a day (BID) | SUBCUTANEOUS | Status: DC
  Administered 2015-01-29: 40 [IU] via SUBCUTANEOUS

## 2015-01-29 MED ORDER — ATORVASTATIN CALCIUM 80 MG PO TABS: 80.0000 mg | ORAL_TABLET | Freq: Every day | ORAL | Status: AC

## 2015-01-29 MED ORDER — CLOPIDOGREL BISULFATE 75 MG PO TABS
75.0000 mg | ORAL_TABLET | Freq: Every day | ORAL | Status: DC
  Administered 2015-01-29: 75 mg via ORAL
  Filled 2015-01-29: qty 1

## 2015-01-29 MED ORDER — METOPROLOL TARTRATE 25 MG PO TABS
25.0000 mg | ORAL_TABLET | Freq: Three times a day (TID) | ORAL | Status: DC
Start: 2015-01-29 — End: 2019-01-19

## 2015-01-29 MED ORDER — CLOPIDOGREL BISULFATE 75 MG PO TABS: 75.0000 mg | ORAL_TABLET | Freq: Every day | ORAL | Status: DC

## 2015-01-29 MED ORDER — BLOOD GLUCOSE MONITOR KIT: PACK | Status: AC

## 2015-01-29 MED ORDER — ALBUTEROL SULFATE (2.5 MG/3ML) 0.083% IN NEBU
2.5000 mg | INHALATION_SOLUTION | Freq: Once | RESPIRATORY_TRACT | Status: AC
  Administered 2015-01-29: 2.5 mg via RESPIRATORY_TRACT

## 2015-01-29 MED ORDER — "SYRINGE 26G X 5/8"" 3 ML MISC": 1.0000 | Freq: Two times a day (BID) | Status: AC

## 2015-01-29 MED ORDER — FUROSEMIDE 20 MG PO TABS: 20.0000 mg | ORAL_TABLET | Freq: Every day | ORAL | Status: DC | PRN

## 2015-01-29 NOTE — Progress Notes (Signed)
Pt declined walking today. He is excited to d/c. Discussed MI, diet, walking, NTG, IS and CRPII. Voiced understanding but is not sure what facility his surgery will be at. To have HHRN and PT which will help with strengthening. Pt has watched DM videos. Would benefit from outpatient teaching. Gave [facility] video to watch.  [id number] Dave Cervantes CES, ACSM 2:15 PM 01/29/2015

## 2015-01-29 NOTE — Progress Notes (Signed)
Occupational Therapy Treatment Patient Details Name: Dave Cervantes MRN: [id number] DOB: Jul 20, 1951 Today's Date: 01/29/2015    History of present illness 64 yo with history of HTN and hypothyroidism was admitted on 2/15 with lethargy and fecal/urinary incontinence. He was found to have glucose 1400 with DKA, AKI, and hyperkalemia.Pt with hypernatremia and NSTEMI along with AMS. Pt is now s/p cardiac cath on 01/28/15.    OT comments  Pt seen today for ADL session. Pt reports that he feels better and required supervision for standing. Pt reports that he would prefer to use a cane and declined use of rollator for in room mobility, requiring min guard assistance for functional mobility. Pt will continue to benefit from acute OT to progress to Mod I level.    Follow Up Recommendations  Home health OT    Equipment Recommendations  Tub/shower seat    Recommendations for Other Services      Precautions / Restrictions Precautions Precautions: Fall Restrictions Weight Bearing Restrictions: No       Mobility Bed Mobility Overal bed mobility: Modified Independent                Transfers Overall transfer level: Needs assistance Equipment used: None Transfers: Sit to/from Stand Sit to Stand: Supervision         General transfer comment: Supervision to stand without DME, for safety.         ADL Overall ADL's : Needs assistance/impaired     Grooming: Wash/dry hands;Wash/dry face;Oral care;Supervision/safety;Standing       Lower Body Bathing: Supervison/ safety;Sit to/from stand       Lower Body Dressing: Supervision/safety;Sit to/from stand   Toilet Transfer: Min guard;Ambulation;Comfort height toilet Toilet Transfer Details (indicate cue type and reason): pt preferred not to use rollator. MIn guard for safety. He reports he would prefer to use a cane.  Toileting- Clothing Manipulation and Hygiene: Supervision/safety;Sit to/from stand       Functional mobility  during ADLs: Min guard (pt declined use of Rollator) General ADL Comments: Pt ambulated to bathroom but declined use of rollator. He reports he would rather use a cane. Pt required min guard for safety with ambulation to bathroom. Pt stood at sink for grooming tasks with Supervision and returned to bed.                 Cognition  Arousal/Alertness: Awake/Alert Behavior During Therapy: WFL for tasks assessed/performed Overall Cognitive Status: Within Functional Limits for tasks assessed Area of Impairment: Memory     Memory: Decreased short-term memory                            Pertinent Vitals/ Pain       Pain Assessment: No/denies pain         Frequency Min 2X/week     Progress Toward Goals  OT Goals(current goals can now be found in the care plan section)  Progress towards OT goals: Progressing toward goals  Acute Rehab OT Goals Patient Stated Goal: "get out of here" OT Goal Formulation: With patient Time For Goal Achievement: 02/10/15 Potential to Achieve Goals: Good  Plan Discharge plan remains appropriate       End of Session Equipment Utilized During Treatment: Gait belt   Activity Tolerance Patient tolerated treatment well   Patient Left in bed;with call bell/phone within reach   Nurse Communication          Time: 1015-1030 OT Time Calculation (min): 15  min  Charges: OT General Charges $OT Visit: 1 Procedure OT Treatments $Self Care/Home Management : 8-22 mins  Rae LipsMiller, Gina Costilla M 01/29/2015, 11:42 AM  Carney LivingLeeAnn Marie Hamsa Laurich, OTR/L Occupational Therapist [phone] (pager)

## 2015-01-29 NOTE — Progress Notes (Signed)
Inpatient Diabetes Program Recommendations  AACE/ADA: New Consensus Statement on Inpatient Glycemic Control (2013)  Target Ranges:  Prepandial:   less than 140 mg/dL      Peak postprandial:   less than 180 mg/dL (1-2 hours)      Critically ill patients:  140 - 180 mg/dL   Results for Corwin LevinsSMITH, Juddson (MRN [id number]) as of 01/29/2015 10:45  Ref. Range 01/28/2015 08:12 01/28/2015 12:32 01/28/2015 13:49 01/28/2015 16:58 01/28/2015 21:47 01/29/2015 08:11  Glucose-Capillary Latest Range: 70-99 mg/dL 782202 (H) 956197 (H) 213207 (H) 192 (H) 196 (H) 233 (H)    Current orders for Inpatient glycemic control: 70/30 38 units QAM, 35 units QPM, Novolog 0-20 units TID  Inpatient Diabetes Program Recommendations  Insulin - Basal: Patient's glucose levels are elevated in both the am and pm. Patient requiring almost 20 units of Novolog in addition to the 70/30.  Please consider increasing 70/30 to 40 units BID (56 units basal insulin, 24 units regular insulin), calculation based on .5u/kg.  HgbA1C: 13.7% on 01/20/15  Thanks,  Christena DeemShannon Shylo Dillenbeck RN, MSN, [facility] Inpatient Diabetes Coordinator Team Pager [phone]

## 2015-01-29 NOTE — Progress Notes (Signed)
SUBJECTIVE: No chest pain or SOB.   BP 128/87 mmHg  Pulse 88  Temp(Src) 99 F (37.2 C) (Oral)  Resp 20  Ht 6' (1.829 m)  Wt 271 lb 6.2 oz (123.1 kg)  BMI 36.80 kg/m2  SpO2 100%  Intake/Output Summary (Last 24 hours) at 01/29/15 [id number] Last data filed at 01/29/15 [id number]  Gross per 24 hour  Intake    360 ml  Output    950 ml  Net   -590 ml   PHYSICAL EXAM General: Well developed, well nourished, in no acute distress. Alert and oriented x 3.  Psych:  Good affect, responds appropriately Neck: No JVD. No masses noted.  Lungs: Clear bilaterally with no wheezes or rhonci noted.  Heart: RRR with no murmurs noted. Abdomen: Bowel sounds are present. Soft, non-tender.  Extremities: Trace bilateral lower extremity edema.   LABS: Basic Metabolic Panel:  Recent Labs  57/84/6902/22/16 0848 01/28/15 0549 01/29/15 0620  NA 139 141 139  K 3.3* 3.5 3.8  CL 109 112 111  CO2 23 24 23   GLUCOSE 194* 227* 270*  BUN 25* 22 16  CREATININE 1.88* 1.88* 1.79*  CALCIUM 8.5 8.2* 8.0*  MG 2.3  --   --    CBC:  Recent Labs  01/28/15 0549 01/29/15 0620  WBC 7.0 7.1  HGB 10.0* 10.0*  HCT 29.7* 30.4*  MCV 95.8 96.5  PLT 161 186   Current Meds: . aspirin  81 mg Oral Daily  . atorvastatin  80 mg Oral q1800  . insulin aspart  0-20 Units Subcutaneous TID WC  . insulin aspart protamine- aspart  35 Units Subcutaneous Q supper  . insulin aspart protamine- aspart  38 Units Subcutaneous Q breakfast  . levothyroxine  150 mcg Oral QAC breakfast  . metoprolol tartrate  25 mg Oral 3 times per day  . senna  1 tablet Oral Daily  . sodium chloride  3 mL Intravenous Q12H   Echo 01/22/15: Left ventricle: The cavity size was normal. There was mild concentric hypertrophy. Systolic function was vigorous. The estimated ejection fraction was in the range of 65% to 70%. There was no dynamic obstruction. Although no diagnostic regional wall motion abnormality was identified, this possibility cannot  be completely excluded on the basis of this study. Doppler parameters are consistent with abnormal left ventricular relaxation (grade 1 diastolic dysfunction). - Mitral valve: Calcified annulus.  Cardiac cath 01/28/15:  Left Main: Long large-caliber vessel with mild luminal irregularities. The bifurcation is very distal with a diminutive circumflex and large LAD. LAD: Large-caliber wraparound vessel that perfuses the distal third of the inferoapex. There is a ostial/proximal tubular eccentric 80% stenosis going from the circumflex to D1. Beyond D1 there is a focal 90-95% stenosis followed by a relatively normal segment with mild prestenotic dilatation before a 40% stenosis. The vessel then is relatively free of disease until the distal/apical region where there is a roughly 70% tubular stenosis.  D1: Large-caliber vessel with mid 40% stenosis prior to bifurcating into one large and one medium-size branch. No significant disease.  Left Circumflex: Very small caliber, diminutive vessel that a sleep courses of this all OM branch. There is diffuse 80% stenosis. The distal vessel does not perfuse much of the myocardium.   RCA: Large-caliber, likely dominant vessel that is 100% occluded in the mid vessel at the takeoff of a normally marginal branch. There does appear to be a branch coming from the near ostial segment that courses over to  the circumflex distribution, suggesting a possible anomalous branch to the circumflex. It is a small caliber vessel.  There is evidence of left to right collaterals from the septal perforators from the LAD to the PDA backfilling what appears to be a relatively large, dominant RCA.   ASSESSMENT AND PLAN:  1. CAD/NSTEMI: Pt admitted with DKA and had significant troponin elevation (over 32). Cardiac cath yesterday per Dr. Herbie Baltimore with severe CAD. LV function is normal by echo. CT surgery has been consulted for CABG but timing will be based on the patients  recovery from his acute illness. This plan will be formulated by Dr. Tyrone Sage. Continue ASA, statin and beta blocker.   2. Acute on chronic renal failure: Creatinine stable post cath.    3. DM: per primary team    [facility]  2/24/20168:26 AM

## 2015-01-29 NOTE — Progress Notes (Signed)
Dave Cervantes to be D/C'd Home per MD order.  Discussed with the patient and all questions fully answered.    Medication List    STOP taking these medications        atenolol 50 MG tablet  Commonly known as:  TENORMIN     lisinopril-hydrochlorothiazide 10-12.5 MG per tablet  Commonly known as:  PRINZIDE,ZESTORETIC      TAKE these medications        aspirin 81 MG tablet  Take 81 mg by mouth daily.     atorvastatin 80 MG tablet  Commonly known as:  LIPITOR  Take 1 tablet (80 mg total) by mouth daily at 6 PM.     blood glucose meter kit and supplies Kit  Dispense based on patient and insurance preference. Use up to four times daily as directed. (FOR ICD-9 250.00, 250.01).     clopidogrel 75 MG tablet  Commonly known as:  PLAVIX  Take 1 tablet (75 mg total) by mouth daily.     furosemide 20 MG tablet  Commonly known as:  LASIX  Take 1 tablet (20 mg total) by mouth daily as needed. For increase in weight of more than 5 lbs in 3 days.     insulin NPH-regular Human (70-30) 100 UNIT/ML injection  Commonly known as:  NOVOLIN 70/30  Inject 40 Units into the skin 2 (two) times daily with a meal. Inject 30 minutes BEFORE meal.     levothyroxine 150 MCG tablet  Commonly known as:  SYNTHROID, LEVOTHROID  Take 150 mcg by mouth daily before breakfast.     metoprolol tartrate 25 MG tablet  Commonly known as:  LOPRESSOR  Take 1 tablet (25 mg total) by mouth every 8 (eight) hours.     SYRINGE 3CC/26GX5/8" 26G X 5/8" 3 ML Misc  1 Syringe by Does not apply route 2 (two) times daily before a meal.        VVS, Skin clean, dry and intact without evidence of skin break down, no evidence of skin tears noted. IV catheter discontinued intact. Site without signs and symptoms of complications. Dressing and pressure applied.  An After Visit Summary was printed and given to the patient.  D/c education completed with patient/family including follow up instructions, medication list, d/c  activities limitations if indicated, with other d/c instructions as indicated by MD - patient able to verbalize understanding, all questions fully answered.   Patient instructed to return to ED, call 911, or call MD for any changes in condition.   Patient escorted via [place], and D/C home via private auto.  [name] D 01/29/2015 3:54 PM

## 2015-01-29 NOTE — Progress Notes (Signed)
Family Medicine Teaching Service Daily Progress Note Intern Pager: [phone]  Patient name: Dave LevinsWilliam Cervantes Medical record number: [id number] Date of birth: May 09, 1951 Age: 64 y.o. Gender: male  Primary Care Provider: No primary care provider on file. Consultants: Neurology, Cardiology Code Status: FULL  Pt Overview and Major Events to Date:  2/16: NSTEMI- elevated troponin with new inverted t-wave in anterior leads 2/17: DKA resolved, lantus started; Troponin trending down 2/18: AMS improved, but not resolved; Na of 160; transfer to tele 2/19: transferred to FPTS, heparin gtt stopped 2/23: cardiac cath this pm.  Assessment and Plan: 64 year old [facility] with AMS x 2 days. In ED was lethargic and hyperglycemic (Glu 1400) with positive urine ketones. PCCM called for admission.  Patient tranferred to FMTS on 01/24/15.   #NSTEMI: Troponins trending down 2.94>>0.86. Echo shows no wall motion abnormalities and preserved EF. -c/s Cardiology: risk factor management, BB and LA nitrates as BP tolerates, cath 2/23 with Severe multivessel disease with a 100% occluded RCA and tandem 80-90% lesions in the ostial/proximal and mid LAD. The ostial LAD lesion compromises a very large diagonal branch. There is also severe disease in the small diminutive circumflex. -c/s to CTS for CABG: agree with CABG inpatient if improved Cr and CBGs vs outpatient in 3-4 weeks if good follow up outpatient.   -?[facility]. [facility] Spoke to CM about this.  Ms Gavin PoundDeborah [phone] will check into this for us. -Heparin gtt stopped 2/19 because of low platelets <95 -ASA 81, Plavix 75  #Thrombocytopenia:  95> 186 this morning.   -Heparin gtt dc'd 2/19 -continue to monitor with CBC daily -HIT panel negative  #DKA: CBG >1400 on admission.  A1c 13.7. CBGs 192-202/24 hours.  -c/s diabetic coordinator, appreciate recs -Will contact DM coordinator upon discharge for  assistance with conversion to NovoLIN 70/30 upon discharge -SSI, resistant  -Continue Novolog 70/30: increase to 40 Units BID starting today.  #Acute metabolic encephalopathy: CT head 2/16 no acute abnormalities.  MRI brain no acute processes.  MRA: as outlined below.  Ammonia 25.  No obvious home medications that would cause AMS.  EEG with nonspecific moderate encephalopathic changes.  Mental status improving.   -Neurology signed off on 01/23/15  #HTN: BP 128/87 this am -Continue lopressor 25 TID -f/u cards recs -Consider ACE-I low dose once AKI resolved   #ARF: Improving. Cr 1.79 today. (Baseline 2015 Cr 1.5) -Daily BMET -Encourage PO fluids  #Hypernatremia: Resolved.  Na 139 today. FeNa 0.49% on 2/15, suggests pre-renal. -SLIV -Daily BMET  #hypokalemia: K 3.8 this am -Daily BMET -Replete PRN  #acute pancreatitis: Resolved. Lipase 2490 on admission, 313 on 2/17.  #hypothyroidism: On synthroid 150mcg at home.  TSH 2.144 -recheck in 6 weeks  #Constipation: -Senna PO  FEN/GI: SLIV, Heart healthy diet PPx: SCDs  Disposition: HH once medically stable. Likely discharge today or tomorrow if able to arrange for close follow up with [facility] and CABG with [facility].  Subjective: Patient reports that he is still unsure if he wants to proceed with CABG.  He plans on discussing this further with family.  We discussed need for close follow up outpatient.  Patient denies CP, SOB, dizziness, n/v.  He reports that he continues to walk and get UOB.  Objective: Temp:  [98 F (36.7 C)-99 F (37.2 C)] 99 F (37.2 C) (02/24 0625) Pulse Rate:  [67-109] 88 (02/23 2136) Resp:  [0-20] 20 (02/24 0625) BP: (127-163)/(71-91) 128/87  mmHg (02/24 0625) SpO2:  [89 %-100 %] 100 % (02/24 0625) Weight:  [271 lb 6.2 oz (123.1 kg)] 271 lb 6.2 oz (123.1 kg) (02/24 0500)   I/O last 3 completed shifts: In: 360 [P.O.:360] Out: 2200 [Urine:2200] Total I/O In: 150 [P.O.:150] Out: -  Physical Exam: General: awake,  alert, NAD, sitting up in bed having breakfast HENT: [place]/AT, EOMI, poor dentition Cardiovascular: RRR, no m/r/g Respiratory: CTAB anteriorly, no increased WOB Abdomen: obese, soft, NT/ND, +BS Extremities: WWP, no cyanosis, clubbing or edema. Right hand with bandage in place Neuro:  AAOx3, speech normal, follows commands, 5/5 LE and UE strength Skin: healing excoriations on b/l shins  Laboratory:  Recent Labs Lab 01/27/15 0848 01/28/15 0549 01/29/15 0620  WBC 8.5 7.0 7.1  HGB 12.4* 10.0* 10.0*  HCT 37.4* 29.7* 30.4*  PLT 146* 161 186    Recent Labs Lab 01/27/15 0848 01/28/15 0549 01/29/15 0620  NA 139 141 139  K 3.3* 3.5 3.8  CL 109 112 111  CO2 BUN 25* 22 16  CREATININE 1.88* 1.88* 1.79*  CALCIUM 8.5 8.2* 8.0*  GLUCOSE 194* 227* 270*    Cardiac Panel (last 3 results) No results for input(s): CKTOTAL, CKMB, TROPONINI, RELINDX in the last 72 hours. CBG (last 3)   Recent Labs  01/28/15 1658 01/28/15 2147 01/29/15 0811  GLUCAP 192* 196* 233*    Imaging/Diagnostic Tests: No new imaging  Raliegh Ip, DO 01/29/2015, 9:47 AM PGY-1, [facility] Family Medicine FPTS Intern pager: [phone], text pages welcome

## 2015-01-29 NOTE — Discharge Instructions (Signed)
You were admitted for DKA (from diabetes).  You were in the ICU for several days and eventually able to come out to the [facility] Medicine service.  You had a heart attack and impaired kidney function as well in the ICU.  You were evaluated by Cardiology, who recommend that you have a Coronary Artery Bypass Grafting procedure done (CABG).  We have found out that this can be done out patient as long as you make sure that you see Dr Diona Foley for follow up in the NEXT WEEK.  She will need to refer you to Cardiology to continue heart monitoring and procedures.   Diabetes Mellitus and Food It is important for you to manage your blood sugar (glucose) level. Your blood glucose level can be greatly affected by what you eat. Eating healthier foods in the appropriate amounts throughout the day at about the same time each day will help you control your blood glucose level. It can also help slow or prevent worsening of your diabetes mellitus. Healthy eating may even help you improve the level of your blood pressure and reach or maintain a healthy weight.  HOW CAN FOOD AFFECT ME? Carbohydrates Carbohydrates affect your blood glucose level more than any other type of food. Your dietitian will help you determine how many carbohydrates to eat at each meal and teach you how to count carbohydrates. Counting carbohydrates is important to keep your blood glucose at a healthy level, especially if you are using insulin or taking certain medicines for diabetes mellitus. Alcohol Alcohol can cause sudden decreases in blood glucose (hypoglycemia), especially if you use insulin or take certain medicines for diabetes mellitus. Hypoglycemia can be a life-threatening condition. Symptoms of hypoglycemia (sleepiness, dizziness, and disorientation) are similar to symptoms of having too much alcohol.  If your health care provider has given you approval to drink alcohol, do so in moderation and use the following guidelines:  Women should not  have more than one drink per day, and men should not have more than two drinks per day. One drink is equal to:  12 oz of beer.  5 oz of wine.  1 oz of hard liquor.  Do not drink on an empty stomach.  Keep yourself hydrated. Have water, diet soda, or unsweetened iced tea.  Regular soda, juice, and other mixers might contain a lot of carbohydrates and should be counted. WHAT FOODS ARE NOT RECOMMENDED? As you make food choices, it is important to remember that all foods are not the same. Some foods have fewer nutrients per serving than other foods, even though they might have the same number of calories or carbohydrates. It is difficult to get your body what it needs when you eat foods with fewer nutrients. Examples of foods that you should avoid that are high in calories and carbohydrates but low in nutrients include:  Trans fats (most processed foods list trans fats on the Nutrition Facts label).  Regular soda.  Juice.  Candy.  Sweets, such as cake, pie, doughnuts, and cookies.  Fried foods. WHAT FOODS CAN I EAT? Have nutrient-rich foods, which will nourish your body and keep you healthy. The food you should eat also will depend on several factors, including:  The calories you need.  The medicines you take.  Your weight.  Your blood glucose level.  Your blood pressure level.  Your cholesterol level. You also should eat a variety of foods, including:  Protein, such as meat, poultry, fish, tofu, nuts, and seeds (lean animal proteins are  best).  Fruits.  Vegetables.  Dairy products, such as milk, cheese, and yogurt (low fat is best).  Breads, grains, pasta, cereal, rice, and beans.  Fats such as olive oil, trans fat-free margarine, canola oil, avocado, and olives. DOES EVERYONE WITH DIABETES MELLITUS HAVE THE SAME MEAL PLAN? Because every person with diabetes mellitus is different, there is not one meal plan that works for everyone. It is very important that you  meet with a dietitian who will help you create a meal plan that is just right for you. Document Released: 08/19/2005 Document Revised: 11/27/2013 Document Reviewed: 10/19/2013 [facility] Patient Information 2015 [place]. This information is not intended to replace advice given to you by your health care provider. Make sure you discuss any questions you have with your health care provider.   Acute Coronary Syndrome Acute coronary syndrome (ACS) is an urgent problem in which the blood and oxygen supply to the heart is critically deficient. ACS requires hospitalization because one or more coronary arteries may be blocked. ACS represents a range of conditions including:  Previous angina that is now unstable, lasts longer, happens at rest, or is more intense.  A heart attack, with heart muscle cell injury and death. There are three vital coronary arteries that supply the heart muscle with blood and oxygen so that it can pump blood effectively. If blockages to these arteries develop, blood flow to the heart muscle is reduced. If the heart does not get enough blood, angina may occur as the first warning sign. SYMPTOMS   The most common signs of angina include:  Tightness or squeezing in the chest.  Feeling of heaviness on the chest.  Discomfort in the arms, neck, back, or jaw.  Shortness of breath and nausea.  Cold, wet skin.  Angina is usually brought on by physical effort or excitement which increase the oxygen needs of the heart. These states increase the blood flow needs of the heart beyond what can be delivered.  Other symptoms that are not as common include:  Fatigue  Unexplained feelings of nervousness or anxiety  Weakness  Diarrhea  Sometimes, you may not have noticed any symptoms at all but still suffered a cardiac injury. TREATMENT   Medicines to help discomfort may include nitroglycerin (nitro) in the form of tablets or a spray for rapid relief, or longer-acting  forms such as cream, patches, or capsules. (Be aware that there are many side effects and possible interactions with other drugs).  Other medicines may be used to help the heart pump better.  Procedures to open blocked arteries including angioplasty or stent placement to keep the arteries open.  Open heart surgery may be needed when there are many blockages or they are in critical locations that are best treated with surgery. HOME CARE INSTRUCTIONS   Do not use any tobacco products including cigarettes, chewing tobacco, or electronic cigarettes.  Take one baby or adult aspirin daily, if your health care provider advises. This helps reduce the risk of a heart attack.  It is very important that you follow the angina treatment prescribed by your health care provider. Make arrangements for proper follow-up care.  Eat a heart healthy diet with salt and fat restrictions as advised.  Regular exercise is good for you as long as it does not cause discomfort. Do not begin any new type of exercise until you check with your health care provider.  If you are overweight, you should lose weight.  Try to maintain normal blood lipid levels.  Keep your blood pressure under control as recommended by your health care provider.  You should tell your health care provider right away about any increase in the severity or frequency of your chest discomfort or angina attacks. When you have angina, you should stop what you are doing and sit down. This may bring relief in 3 to 5 minutes. If your health care provider has prescribed nitro, take it as directed.  If your health care provider has given you a follow-up appointment, it is very important to keep that appointment. Not keeping the appointment could result in a chronic or permanent injury, pain, and disability. If there is any problem keeping the appointment, you must call back to this facility for assistance. SEEK IMMEDIATE MEDICAL CARE IF:   You develop  nausea, vomiting, or shortness of breath.  You feel faint, lightheaded, or pass out.  Your chest discomfort gets worse.  You are sweating or experience sudden profound fatigue.  You do not get relief of your chest pain after 3 doses of nitro.  Your discomfort lasts longer than 15 minutes. MAKE SURE YOU:   Understand these instructions.  Will watch your condition.  Will get help right away if you are not doing well or get worse.  Take all medicines as directed by your health care provider. Document Released: 11/22/2005 Document Revised: 11/27/2013 Document Reviewed: 03/26/2014 [facility] Patient Information 2015 [place], [place]. This information is not intended to replace advice given to you by your health care provider. Make sure you discuss any questions you have with your health care provider.

## 2015-01-31 LAB — GLUCOSE, CAPILLARY: Glucose-Capillary: 238 mg/dL — ABNORMAL HIGH (ref 70–99)

## 2015-01-31 SURGERY — CORONARY ARTERY BYPASS GRAFTING (CABG)
Anesthesia: General | Site: Chest

## 2019-01-17 ENCOUNTER — Emergency Department (HOSPITAL_BASED_OUTPATIENT_CLINIC_OR_DEPARTMENT_OTHER): Payer: No Typology Code available for payment source

## 2019-01-17 ENCOUNTER — Encounter (HOSPITAL_COMMUNITY): Payer: Self-pay | Admitting: *Deleted

## 2019-01-17 ENCOUNTER — Observation Stay (HOSPITAL_COMMUNITY)
Admission: EM | Admit: 2019-01-17 | Discharge: 2019-01-19 | Disposition: A | Discharge status: Home / Self Care | Payer: No Typology Code available for payment source | Attending: Internal Medicine | Admitting: Internal Medicine

## 2019-01-17 ENCOUNTER — Emergency Department (HOSPITAL_COMMUNITY): Payer: No Typology Code available for payment source

## 2019-01-17 DIAGNOSIS — E039 Hypothyroidism, unspecified: Secondary | ICD-10-CM | POA: Insufficient documentation

## 2019-01-17 DIAGNOSIS — R7989 Other specified abnormal findings of blood chemistry: Secondary | ICD-10-CM

## 2019-01-17 DIAGNOSIS — R778 Other specified abnormalities of plasma proteins: Secondary | ICD-10-CM

## 2019-01-17 DIAGNOSIS — Z87891 Personal history of nicotine dependence: Secondary | ICD-10-CM | POA: Diagnosis not present

## 2019-01-17 DIAGNOSIS — E1140 Type 2 diabetes mellitus with diabetic neuropathy, unspecified: Secondary | ICD-10-CM | POA: Insufficient documentation

## 2019-01-17 DIAGNOSIS — R0602 Shortness of breath: Secondary | ICD-10-CM | POA: Diagnosis present

## 2019-01-17 DIAGNOSIS — I739 Peripheral vascular disease, unspecified: Secondary | ICD-10-CM | POA: Insufficient documentation

## 2019-01-17 DIAGNOSIS — R0609 Other forms of dyspnea: Secondary | ICD-10-CM

## 2019-01-17 DIAGNOSIS — G629 Polyneuropathy, unspecified: Secondary | ICD-10-CM | POA: Diagnosis not present

## 2019-01-17 DIAGNOSIS — R2689 Other abnormalities of gait and mobility: Secondary | ICD-10-CM | POA: Diagnosis not present

## 2019-01-17 DIAGNOSIS — I129 Hypertensive chronic kidney disease with stage 1 through stage 4 chronic kidney disease, or unspecified chronic kidney disease: Secondary | ICD-10-CM | POA: Diagnosis not present

## 2019-01-17 DIAGNOSIS — Z951 Presence of aortocoronary bypass graft: Secondary | ICD-10-CM | POA: Diagnosis not present

## 2019-01-17 DIAGNOSIS — Z79899 Other long term (current) drug therapy: Secondary | ICD-10-CM | POA: Insufficient documentation

## 2019-01-17 DIAGNOSIS — Z794 Long term (current) use of insulin: Secondary | ICD-10-CM | POA: Diagnosis not present

## 2019-01-17 DIAGNOSIS — M7989 Other specified soft tissue disorders: Secondary | ICD-10-CM

## 2019-01-17 DIAGNOSIS — I2510 Atherosclerotic heart disease of native coronary artery without angina pectoris: Secondary | ICD-10-CM | POA: Insufficient documentation

## 2019-01-17 DIAGNOSIS — Z7982 Long term (current) use of aspirin: Secondary | ICD-10-CM | POA: Insufficient documentation

## 2019-01-17 DIAGNOSIS — I25118 Atherosclerotic heart disease of native coronary artery with other forms of angina pectoris: Secondary | ICD-10-CM

## 2019-01-17 DIAGNOSIS — N183 Chronic kidney disease, stage 3 (moderate): Secondary | ICD-10-CM | POA: Diagnosis not present

## 2019-01-17 DIAGNOSIS — Z8639 Personal history of other endocrine, nutritional and metabolic disease: Secondary | ICD-10-CM | POA: Insufficient documentation

## 2019-01-17 HISTORY — DX: Polyneuropathy, unspecified: G62.9

## 2019-01-17 HISTORY — DX: Hyperlipidemia, unspecified: E78.5

## 2019-01-17 HISTORY — DX: Other ill-defined heart diseases: I51.89

## 2019-01-17 HISTORY — DX: Venous insufficiency (chronic) (peripheral): I87.2

## 2019-01-17 HISTORY — DX: Atherosclerotic heart disease of native coronary artery without angina pectoris: I25.10

## 2019-01-17 LAB — GLUCOSE, CAPILLARY: Glucose-Capillary: 202 mg/dL — ABNORMAL HIGH (ref 70–99)

## 2019-01-17 LAB — CBC WITH DIFFERENTIAL/PLATELET
Abs Immature Granulocytes: 0.02 10*3/uL (ref 0.00–0.07)
BASOS PCT: 0 %
Basophils Absolute: 0 10*3/uL (ref 0.0–0.1)
EOS PCT: 1 %
Eosinophils Absolute: 0.1 10*3/uL (ref 0.0–0.5)
HEMATOCRIT: 44.8 % (ref 39.0–52.0)
Hemoglobin: 15.3 g/dL (ref 13.0–17.0)
Immature Granulocytes: 0 %
LYMPHS ABS: 3.1 10*3/uL (ref 0.7–4.0)
Lymphocytes Relative: 47 %
MCH: 33.6 pg (ref 26.0–34.0)
MCHC: 34.2 g/dL (ref 30.0–36.0)
MCV: 98.2 fL (ref 80.0–100.0)
MONOS PCT: 8 %
Monocytes Absolute: 0.5 10*3/uL (ref 0.1–1.0)
NRBC: 0 % (ref 0.0–0.2)
Neutro Abs: 2.9 10*3/uL (ref 1.7–7.7)
Neutrophils Relative %: 44 %
Platelets: 198 10*3/uL (ref 150–400)
RBC: 4.56 MIL/uL (ref 4.22–5.81)
RDW: 12.6 % (ref 11.5–15.5)
WBC: 6.6 10*3/uL (ref 4.0–10.5)

## 2019-01-17 LAB — I-STAT TROPONIN, ED
TROPONIN I, POC: 0.12 ng/mL — AB (ref 0.00–0.08)
Troponin i, poc: 0.22 ng/mL (ref 0.00–0.08)

## 2019-01-17 LAB — TSH: TSH: 5.45 u[IU]/mL — ABNORMAL HIGH (ref 0.350–4.500)

## 2019-01-17 LAB — URINALYSIS, ROUTINE W REFLEX MICROSCOPIC
BILIRUBIN URINE: NEGATIVE
Bacteria, UA: NONE SEEN
Glucose, UA: >=500 mg/dL — AB
Hgb urine dipstick: NEGATIVE
Ketones, ur: NEGATIVE mg/dL
LEUKOCYTE UA: NEGATIVE
Nitrite: NEGATIVE
Protein, ur: NEGATIVE mg/dL
SPECIFIC GRAVITY, URINE: 1.015 (ref 1.005–1.030)
pH: 6 (ref 5.0–8.0)

## 2019-01-17 LAB — COMPREHENSIVE METABOLIC PANEL
ALT: 51 U/L — ABNORMAL HIGH (ref 0–44)
AST: 42 U/L — ABNORMAL HIGH (ref 15–41)
Albumin: 4.3 g/dL (ref 3.5–5.0)
Alkaline Phosphatase: 76 U/L (ref 38–126)
Anion gap: 15 (ref 5–15)
BILIRUBIN TOTAL: 0.4 mg/dL (ref 0.3–1.2)
BUN: 12 mg/dL (ref 8–23)
CO2: 25 mmol/L (ref 22–32)
Calcium: 9.1 mg/dL (ref 8.9–10.3)
Chloride: 100 mmol/L (ref 98–111)
Creatinine, Ser: 1.42 mg/dL — ABNORMAL HIGH (ref 0.61–1.24)
GFR calc non Af Amer: 51 mL/min — ABNORMAL LOW (ref >60)
GFR, EST AFRICAN AMERICAN: 59 mL/min — AB (ref >60)
Glucose, Bld: 231 mg/dL — ABNORMAL HIGH (ref 70–99)
Potassium: 3.5 mmol/L (ref 3.5–5.1)
Sodium: 140 mmol/L (ref 135–145)
TOTAL PROTEIN: 7.2 g/dL (ref 6.5–8.1)

## 2019-01-17 LAB — MAGNESIUM: Magnesium: 2 mg/dL (ref 1.7–2.4)

## 2019-01-17 LAB — I-STAT CREATININE, ED: Creatinine, Ser: 1.3 mg/dL — ABNORMAL HIGH (ref 0.61–1.24)

## 2019-01-17 MED ORDER — NITROGLYCERIN 0.4 MG SL SUBL: 0.4000 mg | SUBLINGUAL_TABLET | SUBLINGUAL | Status: DC | PRN

## 2019-01-17 MED ORDER — SODIUM CHLORIDE 0.9 % IV SOLN
INTRAVENOUS | Status: DC
  Administered 2019-01-17: 22:00:00 via INTRAVENOUS

## 2019-01-17 MED ORDER — LEVOTHYROXINE SODIUM 75 MCG PO TABS
175.0000 ug | ORAL_TABLET | Freq: Every day | ORAL | Status: DC
  Administered 2019-01-18: 175 ug via ORAL
  Filled 2019-01-17: qty 1

## 2019-01-17 MED ORDER — LOSARTAN POTASSIUM 50 MG PO TABS
50.0000 mg | ORAL_TABLET | Freq: Every day | ORAL | Status: DC
  Administered 2019-01-18 – 2019-01-19 (×2): 50 mg via ORAL
  Filled 2019-01-17 (×2): qty 1

## 2019-01-17 MED ORDER — ASPIRIN EC 81 MG PO TBEC
81.0000 mg | DELAYED_RELEASE_TABLET | Freq: Every day | ORAL | Status: DC
  Administered 2019-01-19: 81 mg via ORAL
  Filled 2019-01-17: qty 1

## 2019-01-17 MED ORDER — ACETAMINOPHEN 325 MG PO TABS
650.0000 mg | ORAL_TABLET | ORAL | Status: DC | PRN
  Administered 2019-01-18 (×2): 650 mg via ORAL
  Filled 2019-01-17 (×2): qty 2

## 2019-01-17 MED ORDER — GABAPENTIN 300 MG PO CAPS
300.0000 mg | ORAL_CAPSULE | Freq: Three times a day (TID) | ORAL | Status: DC
  Administered 2019-01-18 (×3): 300 mg via ORAL
  Filled 2019-01-17 (×3): qty 1

## 2019-01-17 MED ORDER — IOPAMIDOL (ISOVUE-370) INJECTION 76%
INTRAVENOUS | Status: AC
  Filled 2019-01-17: qty 100

## 2019-01-17 MED ORDER — ASPIRIN 81 MG PO CHEW
324.0000 mg | CHEWABLE_TABLET | Freq: Once | ORAL | Status: AC
  Administered 2019-01-17: 324 mg via ORAL
  Filled 2019-01-17: qty 4

## 2019-01-17 MED ORDER — HEPARIN (PORCINE) 25000 UT/250ML-% IV SOLN
1300.0000 [IU]/h | INTRAVENOUS | Status: DC
  Administered 2019-01-17: 1300 [IU]/h via INTRAVENOUS
  Filled 2019-01-17: qty 250

## 2019-01-17 MED ORDER — ATORVASTATIN CALCIUM 80 MG PO TABS
80.0000 mg | ORAL_TABLET | Freq: Every day | ORAL | Status: DC
  Administered 2019-01-17 – 2019-01-18 (×2): 80 mg via ORAL
  Filled 2019-01-17 (×2): qty 1

## 2019-01-17 MED ORDER — INSULIN ASPART 100 UNIT/ML ~~LOC~~ SOLN
0.0000 [IU] | Freq: Four times a day (QID) | SUBCUTANEOUS | Status: DC
  Administered 2019-01-18: 3 [IU] via SUBCUTANEOUS
  Administered 2019-01-18: 5 [IU] via SUBCUTANEOUS

## 2019-01-17 MED ORDER — METOPROLOL TARTRATE 50 MG PO TABS
50.0000 mg | ORAL_TABLET | Freq: Two times a day (BID) | ORAL | Status: DC
  Administered 2019-01-18 – 2019-01-19 (×3): 50 mg via ORAL
  Filled 2019-01-17 (×4): qty 1

## 2019-01-17 MED ORDER — MORPHINE SULFATE (PF) 2 MG/ML IV SOLN: 2.0000 mg | INTRAVENOUS | Status: DC | PRN

## 2019-01-17 MED ORDER — INSULIN GLARGINE 100 UNIT/ML ~~LOC~~ SOLN
12.0000 [IU] | Freq: Every day | SUBCUTANEOUS | Status: DC
  Administered 2019-01-18: 12 [IU] via SUBCUTANEOUS
  Filled 2019-01-17 (×3): qty 0.12

## 2019-01-17 MED ORDER — CLOPIDOGREL BISULFATE 75 MG PO TABS
75.0000 mg | ORAL_TABLET | Freq: Every day | ORAL | Status: DC
  Administered 2019-01-18 – 2019-01-19 (×2): 75 mg via ORAL
  Filled 2019-01-17 (×2): qty 1

## 2019-01-17 MED ORDER — IOPAMIDOL (ISOVUE-370) INJECTION 76%
150.0000 mL | Freq: Once | INTRAVENOUS | Status: AC | PRN
  Administered 2019-01-17: 150 mL via INTRAVENOUS

## 2019-01-17 MED ORDER — HEPARIN BOLUS VIA INFUSION
4000.0000 [IU] | Freq: Once | INTRAVENOUS | Status: AC
  Administered 2019-01-17: 4000 [IU] via INTRAVENOUS
  Filled 2019-01-17: qty 4000

## 2019-01-17 MED ORDER — IOPAMIDOL (ISOVUE-370) INJECTION 76%
INTRAVENOUS | Status: AC
  Filled 2019-01-17: qty 50

## 2019-01-17 MED ORDER — ONDANSETRON HCL 4 MG/2ML IJ SOLN: 4.0000 mg | Freq: Four times a day (QID) | INTRAMUSCULAR | Status: DC | PRN

## 2019-01-17 NOTE — Progress Notes (Signed)
ANTICOAGULATION CONSULT NOTE - Initial Consult  Pharmacy Consult for heparin Indication: chest pain/ACS  No Known Allergies  Patient Measurements:   Heparin Dosing Weight:   Vital Signs: Temp: 97.8 F (36.6 C) (02/12 1455) Temp Source: Oral (02/12 1455) BP: 155/91 (02/12 1455) Pulse Rate: 79 (02/12 1900)  Labs: Recent Labs    01/17/19 1542 01/17/19 1602  HGB 15.3  --   HCT 44.8  --   PLT 198  --   CREATININE 1.42* 1.30*    CrCl cannot be calculated (Unknown ideal weight.).   Medical History: Past Medical History:  Diagnosis Date  . CAD (coronary artery disease)    a. 09/2015 NSTEMI in setting of DKA/Cath: LM min irregs, LAD 80ost/p, 90-95p, 86m, 70d, D1 40, LCX80diff, RCA 100; b. 10/2015 s/p CABG [facility]).  . Diastolic dysfunction    a. 01/2015 Echo: EF 65-70%, Gr1 DD.  Marland Kitchen Graves disease   . Hyperlipidemia   . Hypertension   . Hypothyroidism   . Peripheral neuropathy   . Venous insufficiency    Assessment: 68 yo M presents with dyspnea on exertion. Cards consulted, considering this an NSTEMI. CBC wnl.  Goal of Therapy:  Heparin level 0.3-0.7 units/ml Monitor platelets by anticoagulation protocol: Yes   Plan:  Give heparin 4,000 unit bolus Start heparin gtt at 1,300 units/hr Monitor daily heparin level, CBC, s/s of bleed  Dave Cervantes J 01/17/2019,7:55 PM

## 2019-01-17 NOTE — Progress Notes (Signed)
RLE venous duplex       has been completed. Preliminary results can be found under CV proc through chart review. Trejan Buda, BS, RDMS, RVT   

## 2019-01-17 NOTE — Consult Note (Addendum)
Cardiology Consult    Patient ID: Dave Cervantes MRN: [id number], DOB/AGE: Nov 30, 1951   Admit date: 01/17/2019 Date of Consult: 01/17/2019  Primary Physician: System, Pcp Not In Primary Cardiologist: Seen @ [facility] in [place] Requesting Provider: D. Tyrone Nine, MD  Patient Profile    Dave Cervantes is a 68 y.o. male with a history of coronary artery disease status post CABG in 2016, hypertension, hyperlipidemia, diabetes mellitus, peripheral neuropathy, venous insufficiency, and diastolic dysfunction, who is being seen today for the evaluation of dyspnea and elevated troponin at the request of Dr. Tyrone Nine.  Past Medical History   Past Medical History:  Diagnosis Date  . CAD (coronary artery disease)    a. 09/2015 NSTEMI in setting of DKA/Cath: LM min irregs, LAD 80ost/p, 90-95p, 11m 70d, D1 40, LCX80diff, RCA 100; b. 10/2015 s/p CABG ([facility]  . Diastolic dysfunction    a. 01/2015 Echo: EF 65-70%, Gr1 DD.  .Marland KitchenGraves disease   . Hyperlipidemia   . Hypertension   . Hypothyroidism   . Peripheral neuropathy   . Venous insufficiency     Past Surgical History:  Procedure Laterality Date  . LEFT HEART CATHETERIZATION WITH CORONARY ANGIOGRAM N/A 01/28/2015   Procedure: LEFT HEART CATHETERIZATION WITH CORONARY ANGIOGRAM;  Surgeon: DLeonie Man MD;  [facility];  Service: Cardiovascular;  Laterality: N/A;     Allergies  No Known Allergies  History of Present Illness    68year old male with the above past medical history including coronary artery disease status post non-STEMI in October 2016 in the setting of DKA.  At that time, he was found to have severe three-vessel coronary artery disease and [facility]  Other history includes hypertension, hyperlipidemia, diabetes, hypothyroidism, venous insufficiency, and peripheral neuropathy.  Patient has not been followed by cardiology since his bypass.   He says his activity has been limited by venous insufficiency and peripheral neuropathy with numbness of his feet, making his balance unsteady.  Peripheral neuropathy has been progressive over the past few years and he has significant numbness of his right ankle and right foot and less pronounced numbness of his left foot.  He was in his usual state of health until approximately 1 week ago, when he began to experience progressive dyspnea on exertion.  He has not had any chest pain but notes prior anginal equivalent was dyspnea.  He denies weight gain, PND, orthopnea, dizziness, syncope, edema, or early satiety.  Due to progressive dyspnea and also peripheral neuropathy, he presented to the emergency department today.  Here, he was hypertensive on arrival and was found to have mild troponin elevation of 0.12.  ECG notable for right bundle branch block and prior inferior infarct.  He is currently chest pain-free and denies any dyspnea at rest.  Inpatient Medications    Prior to Admission medications   Medication Sig Start Date End Date Taking? Authorizing Provider  aspirin 81 MG tablet Take 81 mg by mouth daily.   Yes [provider]  atorvastatin (LIPITOR) 80 MG tablet Take 1 tablet (80 mg total) by mouth daily at 6 PM. 01/29/15  Yes Gottschalk, Ashly M, DO  furosemide (LASIX) 20 MG tablet Take 1 tablet (20 mg total) by mouth daily as needed. For increase in weight of more than 5 lbs in 3 days. Patient taking differently: Take 40 mg by mouth daily.  01/29/15  Yes Gottschalk, ALeatrice JewelsM, DO  gabapentin (NEURONTIN) 400 MG  capsule Take 800 mg by mouth 3 (three) times daily.   Yes [provider]  insulin NPH Human (HUMULIN N,NOVOLIN N) 100 UNIT/ML injection Inject 15-16 Units into the skin See admin instructions. 15 units in the morning and 16 units in the evening   Yes [provider]  levothyroxine (SYNTHROID, LEVOTHROID) 175 MCG tablet Take 175 mcg by mouth daily before breakfast.     Yes [provider]  losartan (COZAAR) 100 MG tablet Take 50 mg by mouth daily.   Yes [provider]  metoprolol tartrate (LOPRESSOR) 50 MG tablet Take 50 mg by mouth 2 (two) times daily.   Yes [provider]  naproxen sodium (ALEVE) 220 MG tablet Take 220 mg by mouth 2 (two) times daily as needed (pain).   Yes [provider]  blood glucose meter kit and supplies KIT Dispense based on patient and insurance preference. Use up to four times daily as directed. (FOR ICD-9 250.00, 250.01). 01/29/15   Ronnie Doss M, DO  clopidogrel (PLAVIX) 75 MG tablet Take 1 tablet (75 mg total) by mouth daily. Patient not taking: Reported on 01/17/2019 01/29/15   Janora Norlander, DO  insulin NPH-regular Human (NOVOLIN 70/30) (70-30) 100 UNIT/ML injection Inject 40 Units into the skin 2 (two) times daily with a meal. Inject 30 minutes BEFORE meal. Patient not taking: Reported on 01/17/2019 01/29/15   Janora Norlander, DO  metoprolol tartrate (LOPRESSOR) 25 MG tablet Take 1 tablet (25 mg total) by mouth every 8 (eight) hours. Patient not taking: Reported on 01/17/2019 01/29/15   Janora Norlander, DO  Syringe/Needle, Disp, (SYRINGE 3CC/26GX5/8") 26G X 5/8" 3 ML MISC 1 Syringe by Does not apply route 2 (two) times daily before a meal. 01/29/15   Janora Norlander, DO   Family History    Family History  Problem Relation Age of Onset  . Diabetes Mother   . Coronary artery disease Mother   . Diabetes Sister   . Dementia Father    He indicated that the status of his mother is unknown. He indicated that the status of his father is unknown. He indicated that the status of his sister is unknown.   Social History    Social History   Socioeconomic History  . Marital status: Single    Spouse name: Not on file  . Number of children: Not on file  . Years of education: Not on file  . Highest education level: Not on file  Occupational History  . Not on file  Social  Needs  . Financial resource strain: Not on file  . Food insecurity:    Worry: Not on file    Inability: Not on file  . Transportation needs:    Medical: Not on file    Non-medical: Not on file  Tobacco Use  . Smoking status: Former Smoker    Last attempt to quit: 12/06/1984    Years since quitting: 34.1  Substance and Sexual Activity  . Alcohol use: Never    Frequency: Never  . Drug use: Never  . Sexual activity: Not on file  Lifestyle  . Physical activity:    Days per week: Not on file    Minutes per session: Not on file  . Stress: Not on file  Relationships  . Social connections:    Talks on phone: Not on file    Gets together: Not on file    Attends religious service: Not on file    Active member  of club or organization: Not on file    Attends meetings of clubs or organizations: Not on file    Relationship status: Not on file  . Intimate partner violence:    Fear of current or ex partner: Not on file    Emotionally abused: Not on file    Physically abused: Not on file    Forced sexual activity: Not on file  Other Topics Concern  . Not on file  Social History Narrative  . Not on file     Review of Systems    General:  No chills, fever, night sweats or weight changes.  Cardiovascular:  No chest pain, +++ dyspnea on exertion, no edema, orthopnea, palpitations, paroxysmal nocturnal dyspnea. Dermatological: No rash, lesions/masses Respiratory: No cough, +++ dyspnea Urologic: No hematuria, dysuria Abdominal:   No nausea, vomiting, diarrhea, bright red blood per rectum, melena, or hematemesis Neurologic:  No visual changes, wkns, changes in mental status.  Right greater than left lower extremity peripheral neuropathy/numbness. All other systems reviewed and are otherwise negative except as noted above.  Physical Exam    Blood pressure (!) 155/91, pulse 79, temperature 97.8 F (36.6 C), temperature source Oral, resp. rate 18, SpO2 97 %.  General: Pleasant, NAD Psych:  Normal affect. Neuro: Alert and oriented X 3. Moves all extremities spontaneously. HEENT: Normal  Neck: Supple without bruits or JVD. Lungs:  Resp regular and unlabored, CTA. Heart: RRR no s3, s4, or murmurs. Abdomen: Soft, non-tender, non-distended, BS + x 4.  Extremities: No clubbing, cyanosis or edema. DP/PT/Radials 2+ and equal bilaterally.  Right greater than left chronic venous stasis skin changes.  Labs    Troponin [facility] of Care Test) Recent Labs    01/17/19 1546  TROPIPOC 0.12*    Lab Results  Component Value Date   WBC 6.6 01/17/2019   HGB 15.3 01/17/2019   HCT 44.8 01/17/2019   MCV 98.2 01/17/2019   PLT 198 01/17/2019    Recent Labs  Lab 01/17/19 1542 01/17/19 1602  NA 140  --   K 3.5  --   CL 100  --   CO2 25  --   BUN 12  --   CREATININE 1.42* 1.30*  CALCIUM 9.1  --   PROT 7.2  --   BILITOT 0.4  --   ALKPHOS 76  --   ALT 51*  --   AST 42*  --   GLUCOSE 231*  --    Lab Results  Component Value Date   CHOL 251 (H) 01/21/2015   HDL 25 (L) 01/21/2015   LDLCALC UNABLE TO CALCULATE IF TRIGLYCERIDE OVER 400 mg/dL 01/21/2015   TRIG 418 (H) 01/21/2015   No results found for: [facility]   Radiology Studies    Ct Angio Head W/cm &/or Wo Cm  Result Date: 01/17/2019 CLINICAL DATA:  Imbalance. BILATERAL leg pain. Diabetes. Hypertension. EXAM: CT ANGIOGRAPHY HEAD AND NECK TECHNIQUE: Multidetector CT imaging of the head and neck was performed using the standard protocol during bolus administration of intravenous contrast. Multiplanar CT image reconstructions and MIPs were obtained to evaluate the vascular anatomy. Carotid stenosis measurements (when applicable) are obtained utilizing NASCET criteria, using the distal internal carotid diameter as the denominator. CONTRAST:  75 mL ISOVUE-370 IOPAMIDOL (ISOVUE-370) INJECTION 76% COMPARISON:  MR head 01/22/2015. CTA chest reported separately. FINDINGS: CT HEAD FINDINGS Brain: No evidence for acute infarction, hemorrhage,  mass lesion, hydrocephalus, or extra-axial fluid. Normal for age cerebral volume. Hypoattenuation of white matter, likely small vessel  disease. Vascular: Calcification of the cavernous internal carotid arteries consistent with cerebrovascular atherosclerotic disease. No signs of intracranial large vessel occlusion. Skull: Calvarium intact. No worrisome osseous lesion. Sinuses: Unremarkable. Orbits: No acute findings. CTA NECK FINDINGS Aortic arch: Bovine trunk. 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 No common carotid or RIGHT subclavian stenosis is evident. Right carotid system: There is calcified and noncalcified plaque at the carotid bifurcation extending into the RIGHT internal carotid artery over length of approximately 3 cm. At the most narrow segment, there is a luminal web associated with a heavily calcified plaque resulting in 50% stenosis based on measurements of 2.2/4.4 proximal/distal. No fibromuscular change. No concern for distal ICA dissection. Left carotid system: No evidence of dissection, stenosis (50% or greater) or occlusion. Calcified and noncalcified plaque at the bifurcation. No focal areas of concerning stenosis Vertebral arteries: BILATERAL vertebral artery patency is established and the vessels are relatively symmetric. LEFT vertebral origin is widely patent. RIGHT vertebral origin is severely diseased, estimated 75-90% stenosis. Skeleton: Spondylosis. Poor dentition. Numerous missing and diseased teeth. Other neck: No soft tissue abnormalities of significance. Upper chest: Reported separately. Review of the MIP images confirms the above findings CTA HEAD FINDINGS Anterior circulation: Calcification of the cavernous internal carotid arteries consistent with cerebrovascular atherosclerotic disease. No flow-limiting stenosis or branch occlusion. No saccular aneurysm. Posterior circulation: Heavily calcified plaque with significant luminal narrowing of the RIGHT vertebral artery V4 segment,  adjacent to PICA, estimated 75-90% stenosis. Otherwise, no significant stenosis, proximal occlusion, aneurysm, or vascular malformation. Venous sinuses: As permitted by contrast timing, patent. Anatomic variants: None of significance. Delayed phase: No abnormal postcontrast enhancement. Review of the MIP images confirms the above findings IMPRESSION: 1. Calcified and noncalcified plaque at the RIGHT internal carotid artery resulting in 50% stenosis. 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 2. Calcified and noncalcified plaque at the LEFT carotid bifurcation without significant stenosis. 3. Severely diseased RIGHT vertebral origin, estimated 75-90% stenosis with an additional tandem lesion of equal severity in the RIGHT vertebral V4 segment. Significance uncertain given the patent LEFT vertebral. 4. No anterior circulation intracranial stenosis or branch occlusion. No acute intracranial findings. Atrophy and small vessel disease. 5. Poor dentition. Electronically Signed   By: Staci Righter M.D.   On: 01/17/2019 18:02   Dg Chest 2 View  Result Date: 01/17/2019 CLINICAL DATA:  BILATERAL leg pain. EXAM: CHEST - 2 VIEW COMPARISON:  01/21/2015. FINDINGS: Normal heart size. Clear lung fields. No bony abnormality. Prior CABG. No effusion pneumothorax. IMPRESSION: No active cardiopulmonary disease. Electronically Signed   By: Staci Righter M.D.   On: 01/17/2019 12:53   Ct Angio Neck W And/or Wo Contrast  Result Date: 01/17/2019 CLINICAL DATA:  Imbalance. BILATERAL leg pain. Diabetes. Hypertension. EXAM: CT ANGIOGRAPHY HEAD AND NECK TECHNIQUE: Multidetector CT imaging of the head and neck was performed using the standard protocol during bolus administration of intravenous contrast. Multiplanar CT image reconstructions and MIPs were obtained to evaluate the vascular anatomy. Carotid stenosis measurements (when applicable) are obtained utilizing NASCET criteria,  using the distal internal carotid diameter as the denominator. CONTRAST:  75 mL ISOVUE-370 IOPAMIDOL (ISOVUE-370) INJECTION 76% COMPARISON:  MR head 01/22/2015. CTA chest reported separately. FINDINGS: CT HEAD FINDINGS Brain: No evidence for acute infarction, hemorrhage, mass lesion, hydrocephalus, or extra-axial fluid. Normal for age cerebral volume. Hypoattenuation of white matter, likely small vessel disease. Vascular: Calcification of the cavernous internal carotid arteries consistent with cerebrovascular atherosclerotic disease. No signs of  intracranial large vessel occlusion. Skull: Calvarium intact. No worrisome osseous lesion. Sinuses: Unremarkable. Orbits: No acute findings. CTA NECK FINDINGS Aortic arch: Bovine trunk. 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 No common carotid or RIGHT subclavian stenosis is evident. Right carotid system: There is calcified and noncalcified plaque at the carotid bifurcation extending into the RIGHT internal carotid artery over length of approximately 3 cm. At the most narrow segment, there is a luminal web associated with a heavily calcified plaque resulting in 50% stenosis based on measurements of 2.2/4.4 proximal/distal. No fibromuscular change. No concern for distal ICA dissection. Left carotid system: No evidence of dissection, stenosis (50% or greater) or occlusion. Calcified and noncalcified plaque at the bifurcation. No focal areas of concerning stenosis Vertebral arteries: BILATERAL vertebral artery patency is established and the vessels are relatively symmetric. LEFT vertebral origin is widely patent. RIGHT vertebral origin is severely diseased, estimated 75-90% stenosis. Skeleton: Spondylosis. Poor dentition. Numerous missing and diseased teeth. Other neck: No soft tissue abnormalities of significance. Upper chest: Reported separately. Review of the MIP images confirms the above findings CTA HEAD FINDINGS Anterior circulation: Calcification of the cavernous internal  carotid arteries consistent with cerebrovascular atherosclerotic disease. No flow-limiting stenosis or branch occlusion. No saccular aneurysm. Posterior circulation: Heavily calcified plaque with significant luminal narrowing of the RIGHT vertebral artery V4 segment, adjacent to PICA, estimated 75-90% stenosis. Otherwise, no significant stenosis, proximal occlusion, aneurysm, or vascular malformation. Venous sinuses: As permitted by contrast timing, patent. Anatomic variants: None of significance. Delayed phase: No abnormal postcontrast enhancement. Review of the MIP images confirms the above findings IMPRESSION: 1. Calcified and noncalcified plaque at the RIGHT internal carotid artery resulting in 50% stenosis. 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 2. Calcified and noncalcified plaque at the LEFT carotid bifurcation without significant stenosis. 3. Severely diseased RIGHT vertebral origin, estimated 75-90% stenosis with an additional tandem lesion of equal severity in the RIGHT vertebral V4 segment. Significance uncertain given the patent LEFT vertebral. 4. No anterior circulation intracranial stenosis or branch occlusion. No acute intracranial findings. Atrophy and small vessel disease. 5. Poor dentition. Electronically Signed   By: Staci Righter M.D.   On: 01/17/2019 18:02   Ct Angio Chest Pe W/cm &/or Wo Cm  Result Date: 01/17/2019 CLINICAL DATA:  BILATERAL leg pain, shortness of breath which is increased in the last few days, cough, history hypertension EXAM: CT ANGIOGRAPHY CHEST WITH CONTRAST TECHNIQUE: Multidetector CT imaging of the chest was performed using the standard protocol during bolus administration of intravenous contrast. Multiplanar CT image reconstructions and MIPs were obtained to evaluate the vascular anatomy. CONTRAST:  153m ISOVUE-370 IOPAMIDOL (ISOVUE-370) INJECTION 76% IV COMPARISON:  None FINDINGS: Cardiovascular:  Postsurgical changes of CABG. Upper normal size of cardiac chambers. Aorta normal caliber without aneurysm or dissection. No pericardial effusion. Pulmonary arteries adequately opacified and patent. No evidence of pulmonary embolism. Scattered coronary arterial calcifications. Mediastinum/Nodes: Esophagus normal appearance. Base of cervical region normal appearance. No thoracic adenopathy. Lungs/Pleura: Minimal dependent atelectasis and central peribronchial thickening. No pulmonary infiltrate, pleural effusion or pneumothorax. Upper Abdomen: Visualized upper abdomen unremarkable Musculoskeletal: Prior median sternotomy. No acute osseous findings. Review of the MIP images confirms the above findings. IMPRESSION: No evidence of pulmonary embolism. Minimal atelectasis and peribronchial thickening. No additional acute intrathoracic abnormalities. Electronically Signed   By: MLavonia DanaM.D.   On: 01/17/2019 17:56   Vas [place]Lower Extremity Venous (dvt) (only Mc & Wl 7a-7p)  Result Date: 01/17/2019  Lower Venous Study Indications: Swelling.  Performing Technologist: June Leap RDMS, RVT  Examination Guidelines: A complete evaluation includes B-mode imaging, spectral Doppler, color Doppler, and power Doppler as needed of all accessible portions of each vessel. Bilateral testing is considered an integral part of a complete examination. Limited examinations for reoccurring indications may be performed as noted.  Right Venous Findings: +---------+---------------+---------+-----------+----------+-------+          CompressibilityPhasicitySpontaneityPropertiesSummary +---------+---------------+---------+-----------+----------+-------+ CFV      Full           Yes      Yes                          +---------+---------------+---------+-----------+----------+-------+ SFJ      Full                                                 +---------+---------------+---------+-----------+----------+-------+ FV Prox   Full                                                 +---------+---------------+---------+-----------+----------+-------+ FV Mid   Full                                                 +---------+---------------+---------+-----------+----------+-------+ FV DistalFull                                                 +---------+---------------+---------+-----------+----------+-------+ PFV      Full                                                 +---------+---------------+---------+-----------+----------+-------+ POP      Full           Yes      Yes                          +---------+---------------+---------+-----------+----------+-------+ PTV      Full                                                 +---------+---------------+---------+-----------+----------+-------+ PERO     Full                                                 +---------+---------------+---------+-----------+----------+-------+  Left Venous Findings: +---+---------------+---------+-----------+----------+-------+    CompressibilityPhasicitySpontaneityPropertiesSummary +---+---------------+---------+-----------+----------+-------+ CFVFull           Yes      Yes                          +---+---------------+---------+-----------+----------+-------+  Summary: Right: There is no evidence of deep vein thrombosis in the lower extremity. No cystic structure found in the popliteal fossa. Left: No evidence of common femoral vein obstruction.  *See table(s) above for measurements and observations.    Preliminary     ECG & Cardiac Imaging    Sinus tachycardia, 111, left axis deviation, inferior infarct, right bundle branch block-  personally reviewed -right bundle branch block is new since his EKG in 2016  Assessment & Plan    1.  Non-STEMI/CAD: Patient presents with a one-week history of progressive dyspnea on exertion.  He identifies dyspnea as a prior anginal equivalent.  Here, troponin  is mildly elevated at 0.12.  He will require admission and additional cycling of cardiac enzymes as well as heparinization.  Continue aspirin, statin, beta-blocker, Plavix, and ARB therapy.  Pending troponin trend, we will consider diagnostic catheterization in the a.m, though he did receive contrast for CT angiography of the chest, head, and neck in the ER and in the setting of renal insufficiency, we may wish to defer.  2.  Essential hypertension: Blood pressure elevated on arrival.  Resume home medications, follow, and adjust as necessary.  3.  Hyperlipidemia: Continue high potency statin therapy.  4.  Type 2 diabetes mellitus: Defer management to internal medicine.  5.  Mild renal insufficiency: Creatinine 1.42 on arrival.  Chronicity unclear.  He did receive contrast.  Follow-up creatinine in a.m. and gently hydrate.  6.  Peripheral neuropathy: Defer management to internal medicine.  This appears have been progressive and he identifies this as one of the main reasons for coming to the ER today.  Signed, Murray Hodgkins, NP 01/17/2019, 7:06 PM   I have examined the patient and reviewed assessment and plan and discussed with patient.  Agree with above as stated.  No definitive history of angina.  He does have some mild shortness of breath.  This is more mild than the symptoms he had with his heart attack.  Mild troponin elevation.  His biggest complaint was in his legs and neuropathy.  Cycle enzymes.  If there is a significant bump, would consider heart catheterization.  Check echocardiogram.  Will keep n.p.o. past midnight.  Will determine plan for testing in the morning.  Larae Grooms   For questions or updates, please contact   Please consult [email or web address] for contact info under Cardiology/STEMI.

## 2019-01-17 NOTE — ED Triage Notes (Signed)
Pt in c/o bilateral leg pain that is chronic from his neuropathy, increased in the last few days, also shortness of breath that has been intermittent for the last few months but increased in the last few days with a cough, no distress noted

## 2019-01-17 NOTE — ED Provider Notes (Signed)
[place] EMERGENCY DEPARTMENT Provider Note   CSN: [id number] Arrival date & time: 01/17/19  1127     History   Chief Complaint Chief Complaint  Patient presents with  . Shortness of Breath  . Leg Pain    HPI Dave Cervantes is a 68 y.o. male presenting for evaluation of SOB, leg pain, and imbalance.   Patient states that overall he has not been doing well for several months, but things have worsened recently.  Over the past week, he reports worsening neuropathy of his bilateral legs, starting his ankles and radiating to his feet.  Patient also states he is having difficulty walking due to imbalance.  No dizziness or imbalance well at rest or sitting.  Additionally, patient states he is having shortness of breath for the past several weeks.  Patient states it feels like he cannot get a full breath of air into his lungs.  This is worse with ambulation.  He reports associated "cold sweats," but denies fevers.  He denies ear pain, nasal congestion, sore throat, chest pain, nausea, vomiting, abdominal pain, or normal bowel movements.  Patient reports dysuria without hematuria or urinary frequency.  He is on Lasix, so states he urinates frequently at baseline.  Additionally, patient with a history of diabetes, his blood sugars have been elevated this past week, 300-5 100s, baseline is around 200. He has been taking insulin as prescribed.  Patient's PCP is the [facility], has not followed up in over a year.  He denies tobacco, alcohol, or drug use.    HPI  Past Medical History:  Diagnosis Date  . CAD (coronary artery disease)    a. 09/2015 NSTEMI in setting of DKA/Cath: LM min irregs, LAD 80ost/p, 90-95p, 3m 70d, D1 40, LCX80diff, RCA 100; b. 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  . Diastolic dysfunction    a. 01/2015 Echo: EF 65-70%, Gr1 DD.  .Marland KitchenGraves disease   . Hyperlipidemia   . Hypertension   . Hypothyroidism   . Peripheral neuropathy   . Venous insufficiency      Patient Active Problem List   Diagnosis Date Noted  . T2DM (type 2 diabetes mellitus) ([place] 01/28/2015  . Weakness generalized 01/27/2015  . CN (constipation)   . Venous thromboembolism   . Encephalopathy, metabolic 035/32/9924 . Altered mental status   . Type 1 diabetes mellitus with ketoacidosis and coma ([place]   . NSTEMI (non-ST elevated myocardial infarction) ([place]   . DKA (diabetic ketoacidoses) ([facility] 01/20/2015  . AKI (acute kidney injury) ([facility] 01/20/2015  . Hyperkalemia 01/20/2015    Past Surgical History:  Procedure Laterality Date  . LEFT HEART CATHETERIZATION WITH CORONARY ANGIOGRAM N/A 01/28/2015   Procedure: LEFT HEART CATHETERIZATION WITH CORONARY ANGIOGRAM;  Surgeon: DLeonie Man MD;  [facility];  Service: Cardiovascular;  Laterality: N/A;        Home Medications    Prior to Admission medications   Medication Sig Start Date End Date Taking? Authorizing Provider  aspirin 81 MG tablet Take 81 mg by mouth daily.   Yes [provider]  atorvastatin (LIPITOR) 80 MG tablet Take 1 tablet (80 mg total) by mouth daily at 6 PM. 01/29/15  Yes Gottschalk, Ashly M, DO  furosemide (LASIX) 20 MG tablet Take 1 tablet (20 mg total) by mouth daily as needed. For increase in weight of more than 5 lbs in 3 days. Patient taking differently: Take 40 mg by mouth daily.  01/29/15  Yes GRonnie Doss  M, DO  gabapentin (NEURONTIN) 400 MG capsule Take 800 mg by mouth 3 (three) times daily.   Yes [provider]  insulin NPH Human (HUMULIN N,NOVOLIN N) 100 UNIT/ML injection Inject 15-16 Units into the skin See admin instructions. 15 units in the morning and 16 units in the evening   Yes [provider]  levothyroxine (SYNTHROID, LEVOTHROID) 175 MCG tablet Take 175 mcg by mouth daily before breakfast.    Yes [provider]  losartan (COZAAR) 100 MG tablet Take 50 mg by mouth daily.   Yes [provider]  metoprolol tartrate  (LOPRESSOR) 50 MG tablet Take 50 mg by mouth 2 (two) times daily.   Yes [provider]  naproxen sodium (ALEVE) 220 MG tablet Take 220 mg by mouth 2 (two) times daily as needed (pain).   Yes [provider]  blood glucose meter kit and supplies KIT Dispense based on patient and insurance preference. Use up to four times daily as directed. (FOR ICD-9 250.00, 250.01). 01/29/15   Ronnie Doss M, DO  clopidogrel (PLAVIX) 75 MG tablet Take 1 tablet (75 mg total) by mouth daily. Patient not taking: Reported on 01/17/2019 01/29/15   Janora Norlander, DO  insulin NPH-regular Human (NOVOLIN 70/30) (70-30) 100 UNIT/ML injection Inject 40 Units into the skin 2 (two) times daily with a meal. Inject 30 minutes BEFORE meal. Patient not taking: Reported on 01/17/2019 01/29/15   Janora Norlander, DO  metoprolol tartrate (LOPRESSOR) 25 MG tablet Take 1 tablet (25 mg total) by mouth every 8 (eight) hours. Patient not taking: Reported on 01/17/2019 01/29/15   Janora Norlander, DO  Syringe/Needle, Disp, (SYRINGE 3CC/26GX5/8") 26G X 5/8" 3 ML MISC 1 Syringe by Does not apply route 2 (two) times daily before a meal. 01/29/15   Janora Norlander, DO    Family History Family History  Problem Relation Age of Onset  . Diabetes Mother   . Coronary artery disease Mother   . Diabetes Sister   . Dementia Father     Social History Social History   Tobacco Use  . Smoking status: Former Smoker    Last attempt to quit: 12/06/1984    Years since quitting: 34.1  Substance Use Topics  . Alcohol use: Never    Frequency: Never  . Drug use: Never     Allergies   Patient has no known allergies.   Review of Systems Review of Systems  Respiratory: Positive for shortness of breath.   Neurological: Positive for numbness (worsening neuropathy).       Imbalance with ambulation  All other systems reviewed and are negative.    Physical Exam Updated Vital Signs BP (!) 158/94   Pulse 79    Temp 97.8 F (36.6 C) (Oral)   Resp 12   SpO2 97%   Physical Exam Vitals signs and nursing note reviewed.  Constitutional:      General: He is not in acute distress.    Appearance: He is well-developed.     Comments: Elderly male in NAD  HENT:     Head: Normocephalic and atraumatic.  Eyes:     Extraocular Movements: Extraocular movements intact.     Conjunctiva/sclera: Conjunctivae normal.     Pupils: Pupils are equal, round, and reactive to light.  Neck:     Musculoskeletal: Normal range of motion and neck supple.  Cardiovascular:     Rate and Rhythm: Normal rate and regular rhythm.     Pulses: Normal pulses.  Pulmonary:     Effort: Pulmonary effort is normal. No respiratory distress.     Breath sounds: Normal breath sounds. No wheezing.     Comments: Speaking in full sentences. Clear lung sounds in all fields Abdominal:     General: There is no distension.     Palpations: Abdomen is soft. There is no mass.     Tenderness: There is no abdominal tenderness. There is no guarding or rebound.  Musculoskeletal:        General: Swelling present.     Right lower leg: Edema present.     Comments: Strength of upper and lower ext intact bilaterally. R lower ext with increased swelling, unknown if better/worse than baseline. Chronic skin changes of bilateral lower extremities. No TTP of calf. Pedal pulses intact. Good cap refill bilaterally.   Skin:    General: Skin is warm and dry.     Capillary Refill: Capillary refill takes less than 2 seconds.  Neurological:     Mental Status: He is alert and oriented to person, place, and time.     Comments: No obvious neuro deficits. CN intact. Nose to finger intact. Negative pronators drift. Strength and sensation intact x4.       ED Treatments / Results  Labs (all labs ordered are listed, but only abnormal results are displayed) Labs Reviewed  COMPREHENSIVE METABOLIC PANEL - Abnormal; Notable for the following components:      Result  Value   Glucose, Bld 231 (*)    Creatinine, Ser 1.42 (*)    AST 42 (*)    ALT 51 (*)    GFR calc non Af Amer 51 (*)    GFR calc Af Amer 59 (*)    All other components within normal limits  URINALYSIS, ROUTINE W REFLEX MICROSCOPIC - Abnormal; Notable for the following components:   Color, Urine STRAW (*)    Glucose, UA >=500 (*)    All other components within normal limits  TSH - Abnormal; Notable for the following components:   TSH 5.450 (*)    All other components within normal limits  I-STAT TROPONIN, ED - Abnormal; Notable for the following components:   Troponin i, poc 0.12 (*)    All other components within normal limits  I-STAT CREATININE, ED - Abnormal; Notable for the following components:   Creatinine, Ser 1.30 (*)    All other components within normal limits  I-STAT TROPONIN, ED - Abnormal; Notable for the following components:   Troponin i, poc 0.22 (*)    All other components within normal limits  URINE CULTURE  CBC WITH DIFFERENTIAL/PLATELET  MAGNESIUM  CBG MONITORING, ED    EKG EKG Interpretation  Date/Time:  Wednesday January 17 2019 11:33:54 EST Ventricular Rate:  111 PR Interval:  158 QRS Duration: 150 QT Interval:  386 QTC Calculation: 524 R Axis:   -83 Text Interpretation:  Sinus tachycardia with Premature atrial complexes Left axis deviation Right bundle branch block Inferior infarct , age undetermined Anterolateral infarct , age undetermined Abnormal ECG Otherwise no significant change Confirmed by Deno Etienne [phone]) on 01/17/2019 3:03:25 PM   Radiology Ct Angio Head W/cm &/or Wo Cm  Result Date: 01/17/2019 CLINICAL DATA:  Imbalance. BILATERAL leg pain. Diabetes. Hypertension. EXAM: CT ANGIOGRAPHY HEAD AND NECK TECHNIQUE: Multidetector CT imaging of the head and neck was performed using the standard protocol during bolus administration of intravenous contrast. Multiplanar CT image reconstructions and MIPs were obtained to evaluate the vascular  anatomy. Carotid stenosis measurements (  when applicable) are obtained utilizing NASCET criteria, using the distal internal carotid diameter as the denominator. CONTRAST:  75 mL ISOVUE-370 IOPAMIDOL (ISOVUE-370) INJECTION 76% COMPARISON:  MR head 01/22/2015. CTA chest reported separately. FINDINGS: CT HEAD FINDINGS Brain: No evidence for acute infarction, hemorrhage, mass lesion, hydrocephalus, or extra-axial fluid. Normal for age cerebral volume. Hypoattenuation of white matter, likely small vessel disease. Vascular: Calcification of the cavernous internal carotid arteries consistent with cerebrovascular atherosclerotic disease. No signs of intracranial large vessel occlusion. Skull: Calvarium intact. No worrisome osseous lesion. Sinuses: Unremarkable. Orbits: No acute findings. CTA NECK FINDINGS Aortic arch: Bovine trunk. 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 No common carotid or RIGHT subclavian stenosis is evident. Right carotid system: There is calcified and noncalcified plaque at the carotid bifurcation extending into the RIGHT internal carotid artery over length of approximately 3 cm. At the most narrow segment, there is a luminal web associated with a heavily calcified plaque resulting in 50% stenosis based on measurements of 2.2/4.4 proximal/distal. No fibromuscular change. No concern for distal ICA dissection. Left carotid system: No evidence of dissection, stenosis (50% or greater) or occlusion. Calcified and noncalcified plaque at the bifurcation. No focal areas of concerning stenosis Vertebral arteries: BILATERAL vertebral artery patency is established and the vessels are relatively symmetric. LEFT vertebral origin is widely patent. RIGHT vertebral origin is severely diseased, estimated 75-90% stenosis. Skeleton: Spondylosis. Poor dentition. Numerous missing and diseased teeth. Other neck: No soft tissue abnormalities of significance. Upper chest: Reported separately. Review of the MIP images confirms the  above findings CTA HEAD FINDINGS Anterior circulation: Calcification of the cavernous internal carotid arteries consistent with cerebrovascular atherosclerotic disease. No flow-limiting stenosis or branch occlusion. No saccular aneurysm. Posterior circulation: Heavily calcified plaque with significant luminal narrowing of the RIGHT vertebral artery V4 segment, adjacent to PICA, estimated 75-90% stenosis. Otherwise, no significant stenosis, proximal occlusion, aneurysm, or vascular malformation. Venous sinuses: As permitted by contrast timing, patent. Anatomic variants: None of significance. Delayed phase: No abnormal postcontrast enhancement. Review of the MIP images confirms the above findings IMPRESSION: 1. Calcified and noncalcified plaque at the RIGHT internal carotid artery resulting in 50% stenosis. 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 2. Calcified and noncalcified plaque at the LEFT carotid bifurcation without significant stenosis. 3. Severely diseased RIGHT vertebral origin, estimated 75-90% stenosis with an additional tandem lesion of equal severity in the RIGHT vertebral V4 segment. Significance uncertain given the patent LEFT vertebral. 4. No anterior circulation intracranial stenosis or branch occlusion. No acute intracranial findings. Atrophy and small vessel disease. 5. Poor dentition. Electronically Signed   By: Staci Righter M.D.   On: 01/17/2019 18:02   Dg Chest 2 View  Result Date: 01/17/2019 CLINICAL DATA:  BILATERAL leg pain. EXAM: CHEST - 2 VIEW COMPARISON:  01/21/2015. FINDINGS: Normal heart size. Clear lung fields. No bony abnormality. Prior CABG. No effusion pneumothorax. IMPRESSION: No active cardiopulmonary disease. Electronically Signed   By: Staci Righter M.D.   On: 01/17/2019 12:53   Ct Angio Neck W And/or Wo Contrast  Result Date: 01/17/2019 CLINICAL DATA:  Imbalance. BILATERAL leg pain. Diabetes. Hypertension. EXAM:  CT ANGIOGRAPHY HEAD AND NECK TECHNIQUE: Multidetector CT imaging of the head and neck was performed using the standard protocol during bolus administration of intravenous contrast. Multiplanar CT image reconstructions and MIPs were obtained to evaluate the vascular anatomy. Carotid stenosis measurements (when applicable) are obtained utilizing NASCET criteria, using the distal internal carotid diameter as the denominator. CONTRAST:  75 mL ISOVUE-370 IOPAMIDOL (ISOVUE-370) INJECTION 76% COMPARISON:  MR head 01/22/2015. CTA chest reported separately. FINDINGS: CT HEAD FINDINGS Brain: No evidence for acute infarction, hemorrhage, mass lesion, hydrocephalus, or extra-axial fluid. Normal for age cerebral volume. Hypoattenuation of white matter, likely small vessel disease. Vascular: Calcification of the cavernous internal carotid arteries consistent with cerebrovascular atherosclerotic disease. No signs of intracranial large vessel occlusion. Skull: Calvarium intact. No worrisome osseous lesion. Sinuses: Unremarkable. Orbits: No acute findings. CTA NECK FINDINGS Aortic arch: Bovine trunk. 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 No common carotid or RIGHT subclavian stenosis is evident. Right carotid system: There is calcified and noncalcified plaque at the carotid bifurcation extending into the RIGHT internal carotid artery over length of approximately 3 cm. At the most narrow segment, there is a luminal web associated with a heavily calcified plaque resulting in 50% stenosis based on measurements of 2.2/4.4 proximal/distal. No fibromuscular change. No concern for distal ICA dissection. Left carotid system: No evidence of dissection, stenosis (50% or greater) or occlusion. Calcified and noncalcified plaque at the bifurcation. No focal areas of concerning stenosis Vertebral arteries: BILATERAL vertebral artery patency is established and the vessels are relatively symmetric. LEFT vertebral origin is widely patent. RIGHT  vertebral origin is severely diseased, estimated 75-90% stenosis. Skeleton: Spondylosis. Poor dentition. Numerous missing and diseased teeth. Other neck: No soft tissue abnormalities of significance. Upper chest: Reported separately. Review of the MIP images confirms the above findings CTA HEAD FINDINGS Anterior circulation: Calcification of the cavernous internal carotid arteries consistent with cerebrovascular atherosclerotic disease. No flow-limiting stenosis or branch occlusion. No saccular aneurysm. Posterior circulation: Heavily calcified plaque with significant luminal narrowing of the RIGHT vertebral artery V4 segment, adjacent to PICA, estimated 75-90% stenosis. Otherwise, no significant stenosis, proximal occlusion, aneurysm, or vascular malformation. Venous sinuses: As permitted by contrast timing, patent. Anatomic variants: None of significance. Delayed phase: No abnormal postcontrast enhancement. Review of the MIP images confirms the above findings IMPRESSION: 1. Calcified and noncalcified plaque at the RIGHT internal carotid artery resulting in 50% stenosis. 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 2. Calcified and noncalcified plaque at the LEFT carotid bifurcation without significant stenosis. 3. Severely diseased RIGHT vertebral origin, estimated 75-90% stenosis with an additional tandem lesion of equal severity in the RIGHT vertebral V4 segment. Significance uncertain given the patent LEFT vertebral. 4. No anterior circulation intracranial stenosis or branch occlusion. No acute intracranial findings. Atrophy and small vessel disease. 5. Poor dentition. Electronically Signed   By: Staci Righter M.D.   On: 01/17/2019 18:02   Ct Angio Chest Pe W/cm &/or Wo Cm  Result Date: 01/17/2019 CLINICAL DATA:  BILATERAL leg pain, shortness of breath which is increased in the last few days, cough, history hypertension EXAM: CT ANGIOGRAPHY CHEST WITH  CONTRAST TECHNIQUE: Multidetector CT imaging of the chest was performed using the standard protocol during bolus administration of intravenous contrast. Multiplanar CT image reconstructions and MIPs were obtained to evaluate the vascular anatomy. CONTRAST:  148m ISOVUE-370 IOPAMIDOL (ISOVUE-370) INJECTION 76% IV COMPARISON:  None FINDINGS: Cardiovascular: Postsurgical changes of CABG. Upper normal size of cardiac chambers. Aorta normal caliber without aneurysm or dissection. No pericardial effusion. Pulmonary arteries adequately opacified and patent. No evidence of pulmonary embolism. Scattered coronary arterial calcifications. Mediastinum/Nodes: Esophagus normal appearance. Base of cervical region normal appearance. No thoracic adenopathy. Lungs/Pleura: Minimal dependent atelectasis and central peribronchial thickening. No pulmonary infiltrate, pleural effusion or pneumothorax. Upper Abdomen: Visualized upper abdomen unremarkable Musculoskeletal: Prior median  sternotomy. No acute osseous findings. Review of the MIP images confirms the above findings. IMPRESSION: No evidence of pulmonary embolism. Minimal atelectasis and peribronchial thickening. No additional acute intrathoracic abnormalities. Electronically Signed   By: Lavonia Dana M.D.   On: 01/17/2019 17:56   Vas [place] Lower Extremity Venous (dvt) (only Mc & Wl 7a-7p)  Result Date: 01/17/2019  Lower Venous Study Indications: Swelling.  Performing Technologist: June Leap RDMS, RVT  Examination Guidelines: A complete evaluation includes B-mode imaging, spectral Doppler, color Doppler, and power Doppler as needed of all accessible portions of each vessel. Bilateral testing is considered an integral part of a complete examination. Limited examinations for reoccurring indications may be performed as noted.  Right Venous Findings: +---------+---------------+---------+-----------+----------+-------+           CompressibilityPhasicitySpontaneityPropertiesSummary +---------+---------------+---------+-----------+----------+-------+ CFV      Full           Yes      Yes                          +---------+---------------+---------+-----------+----------+-------+ SFJ      Full                                                 +---------+---------------+---------+-----------+----------+-------+ FV Prox  Full                                                 +---------+---------------+---------+-----------+----------+-------+ FV Mid   Full                                                 +---------+---------------+---------+-----------+----------+-------+ FV DistalFull                                                 +---------+---------------+---------+-----------+----------+-------+ PFV      Full                                                 +---------+---------------+---------+-----------+----------+-------+ POP      Full           Yes      Yes                          +---------+---------------+---------+-----------+----------+-------+ PTV      Full                                                 +---------+---------------+---------+-----------+----------+-------+ PERO     Full                                                 +---------+---------------+---------+-----------+----------+-------+  Left Venous Findings: +---+---------------+---------+-----------+----------+-------+    CompressibilityPhasicitySpontaneityPropertiesSummary +---+---------------+---------+-----------+----------+-------+ CFVFull           Yes      Yes                          +---+---------------+---------+-----------+----------+-------+    Summary: Right: There is no evidence of deep vein thrombosis in the lower extremity. No cystic structure found in the popliteal fossa. Left: No evidence of common femoral vein obstruction.  *See table(s) above for measurements and  observations.    Preliminary     Procedures .Critical Care Performed by: Franchot Heidelberg, PA-C Authorized by: Franchot Heidelberg, PA-C   Critical care provider statement:    Critical care time (minutes):  40   Critical care time was exclusive of:  Separately billable procedures and treating other patients and teaching time   Critical care was necessary to treat or prevent imminent or life-threatening deterioration of the following conditions:  Cardiac failure   Critical care was time spent personally by me on the following activities:  Blood draw for specimens, development of treatment plan with patient or surrogate, discussions with consultants, evaluation of patient's response to treatment, examination of patient, obtaining history from patient or surrogate, ordering and performing treatments and interventions, ordering and review of laboratory studies, ordering and review of radiographic studies, pulse oximetry, re-evaluation of patient's condition and review of old charts   I assumed direction of critical care for this patient from another provider in my specialty: no   Comments:     Pt with critically elevated troponin, ASA given. Repeat troponin trending up, heparin started.    (including critical care time)  Medications Ordered in ED Medications  iopamidol (ISOVUE-370) 76 % injection (has no administration in time range)  aspirin chewable tablet 324 mg (324 mg Oral Given 01/17/19 1756)  iopamidol (ISOVUE-370) 76 % injection 150 mL (150 mLs Intravenous Contrast Given 01/17/19 1648)     Initial Impression / Assessment and Plan / ED Course  I have reviewed the triage vital signs and the nursing notes.  Pertinent labs & imaging results that were available during my care of the patient were reviewed by me and considered in my medical decision making (see chart for details).     Pt presenting for evaluation of shortness of breath and leg pain.  Physical exam shows patient who  appears nontoxic.  History concerning, because patient states that shortness of breath feels similar to his previous heart attack.  Patient had no chest pain at that time, and has no chest pain currently.  EKG changed from previous, with a new right bundle branch block.  No obvious ST elevation.  However, considering his history will order a troponin.  Additionally, patient reporting imbalance.  History of stroke, diabetes, and vascular disease.  As such, concern for possible posterior circulation blockage. Will order CTA head and neck.  Chest x-ray viewed interpreted by me, no pneumonia, nontoxic effusion, cardiomegaly.  Patient has swelling in his right leg, unknown if it is change from previous.  Considering shortness of breath without URI or infectious symptoms and leg swelling, consider possible PE.  Patient was tachycardic on arrival, though that this did improve without intervention. Pt with h/o graves disease s/p thyroidectomy and on synthroid. Will order tsh.   Labs show a mild increase in creatinine at 1.4, unknown if chronic.  No leukocytosis.  Electrolytes stable.  UA without infection.  Troponin elevated at 0.12.  Considering  change in EKG and elevated troponin, will consult with cardiology.  Per cardiology, recommend starting heparin, trending troponin and possible need for cath in the morning. Admit to medicine. CTA head, neck, and chest reassuring, no acute blockages.  No PE.  CTA head did show significant calcification of the right vertebral artery, left is patent.  Discussed with patient, and importance of follow-up regarding this.  TSH mildly elevated. Likely no the cause of sxs. Will have f/u with PCP for management.  Delta troponin increased from previous to 0.11. Cardiology aware. Will call for admission to hospitalist.  Discussed with Dr. Stana Bunting [facility], patient to be admitted.   Final Clinical Impressions(s) / ED Diagnoses   Final diagnoses:  Elevated  troponin  Dyspnea on exertion  Neuropathy    ED Discharge Orders    None       Franchot Heidelberg, PA-C 01/17/19 2012    Deno Etienne, DO 01/17/19 2115

## 2019-01-18 ENCOUNTER — Encounter (HOSPITAL_COMMUNITY)
Admission: EM | Disposition: A | Discharge status: Home / Self Care | Payer: Self-pay | Source: Home / Self Care | Attending: Emergency Medicine

## 2019-01-18 ENCOUNTER — Other Ambulatory Visit (HOSPITAL_COMMUNITY): Payer: Medicare Other

## 2019-01-18 ENCOUNTER — Observation Stay (HOSPITAL_BASED_OUTPATIENT_CLINIC_OR_DEPARTMENT_OTHER): Payer: No Typology Code available for payment source

## 2019-01-18 ENCOUNTER — Other Ambulatory Visit: Payer: Self-pay

## 2019-01-18 DIAGNOSIS — R0609 Other forms of dyspnea: Secondary | ICD-10-CM

## 2019-01-18 DIAGNOSIS — E1140 Type 2 diabetes mellitus with diabetic neuropathy, unspecified: Secondary | ICD-10-CM | POA: Diagnosis not present

## 2019-01-18 DIAGNOSIS — R0602 Shortness of breath: Secondary | ICD-10-CM

## 2019-01-18 DIAGNOSIS — I2510 Atherosclerotic heart disease of native coronary artery without angina pectoris: Secondary | ICD-10-CM | POA: Diagnosis not present

## 2019-01-18 DIAGNOSIS — G629 Polyneuropathy, unspecified: Secondary | ICD-10-CM

## 2019-01-18 DIAGNOSIS — R7989 Other specified abnormal findings of blood chemistry: Secondary | ICD-10-CM

## 2019-01-18 DIAGNOSIS — N183 Chronic kidney disease, stage 3 (moderate): Secondary | ICD-10-CM | POA: Diagnosis not present

## 2019-01-18 DIAGNOSIS — E039 Hypothyroidism, unspecified: Secondary | ICD-10-CM | POA: Diagnosis not present

## 2019-01-18 LAB — TROPONIN I
TROPONIN I: 0.09 ng/mL — AB (ref <0.03)
Troponin I: 0.15 ng/mL (ref <0.03)

## 2019-01-18 LAB — LIPID PANEL
Cholesterol: 144 mg/dL (ref 0–200)
HDL: 32 mg/dL — ABNORMAL LOW (ref >40)
LDL Cholesterol: 49 mg/dL (ref 0–99)
Total CHOL/HDL Ratio: 4.5 RATIO
Triglycerides: 317 mg/dL — ABNORMAL HIGH (ref <150)
VLDL: 63 mg/dL — ABNORMAL HIGH (ref 0–40)

## 2019-01-18 LAB — CBC
HCT: 39.4 % (ref 39.0–52.0)
Hemoglobin: 13.2 g/dL (ref 13.0–17.0)
MCH: 32.8 pg (ref 26.0–34.0)
MCHC: 33.5 g/dL (ref 30.0–36.0)
MCV: 97.8 fL (ref 80.0–100.0)
Platelets: 180 10*3/uL (ref 150–400)
RBC: 4.03 MIL/uL — ABNORMAL LOW (ref 4.22–5.81)
RDW: 12.8 % (ref 11.5–15.5)
WBC: 5 10*3/uL (ref 4.0–10.5)
nRBC: 0 % (ref 0.0–0.2)

## 2019-01-18 LAB — GLUCOSE, CAPILLARY
Glucose-Capillary: 196 mg/dL — ABNORMAL HIGH (ref 70–99)
Glucose-Capillary: 184 mg/dL — ABNORMAL HIGH (ref 70–99)
Glucose-Capillary: 156 mg/dL — ABNORMAL HIGH (ref 70–99)
Glucose-Capillary: 230 mg/dL — ABNORMAL HIGH (ref 70–99)

## 2019-01-18 LAB — BASIC METABOLIC PANEL
Anion gap: 11 (ref 5–15)
BUN: 10 mg/dL (ref 8–23)
CO2: 25 mmol/L (ref 22–32)
CREATININE: 1.38 mg/dL — AB (ref 0.61–1.24)
Calcium: 8.7 mg/dL — ABNORMAL LOW (ref 8.9–10.3)
Chloride: 105 mmol/L (ref 98–111)
GFR calc Af Amer: >60 mL/min (ref >60)
GFR calc non Af Amer: 53 mL/min — ABNORMAL LOW (ref >60)
Glucose, Bld: 278 mg/dL — ABNORMAL HIGH (ref 70–99)
Potassium: 3.4 mmol/L — ABNORMAL LOW (ref 3.5–5.1)
Sodium: 141 mmol/L (ref 135–145)

## 2019-01-18 LAB — ECHOCARDIOGRAM COMPLETE
Height: 71 in
Weight: 4195.2 oz

## 2019-01-18 LAB — HIV ANTIBODY (ROUTINE TESTING W REFLEX): HIV Screen 4th Generation wRfx: NONREACTIVE

## 2019-01-18 LAB — URINE CULTURE: Culture: NO GROWTH

## 2019-01-18 LAB — HEPARIN LEVEL (UNFRACTIONATED): Heparin Unfractionated: 0.32 IU/mL (ref 0.30–0.70)

## 2019-01-18 LAB — TSH: TSH: 6.423 u[IU]/mL — ABNORMAL HIGH (ref 0.350–4.500)

## 2019-01-18 LAB — VITAMIN B12: Vitamin B-12: 296 pg/mL (ref 180–914)

## 2019-01-18 SURGERY — LEFT HEART CATH AND CORS/GRAFTS ANGIOGRAPHY
Anesthesia: LOCAL

## 2019-01-18 MED ORDER — SODIUM CHLORIDE 0.9% FLUSH
3.0000 mL | Freq: Two times a day (BID) | INTRAVENOUS | Status: DC
  Administered 2019-01-18 – 2019-01-19 (×2): 3 mL via INTRAVENOUS

## 2019-01-18 MED ORDER — GABAPENTIN 400 MG PO CAPS
800.0000 mg | ORAL_CAPSULE | Freq: Three times a day (TID) | ORAL | Status: DC
  Administered 2019-01-18 – 2019-01-19 (×2): 800 mg via ORAL
  Filled 2019-01-18 (×2): qty 2

## 2019-01-18 MED ORDER — INSULIN ASPART 100 UNIT/ML ~~LOC~~ SOLN
0.0000 [IU] | Freq: Three times a day (TID) | SUBCUTANEOUS | Status: DC
  Administered 2019-01-18: 3 [IU] via SUBCUTANEOUS
  Administered 2019-01-19: 2 [IU] via SUBCUTANEOUS
  Administered 2019-01-19: 3 [IU] via SUBCUTANEOUS

## 2019-01-18 MED ORDER — SODIUM CHLORIDE 0.9 % WEIGHT BASED INFUSION
1.0000 mL/kg/h | INTRAVENOUS | Status: DC
  Administered 2019-01-18: 1 mL/kg/h via INTRAVENOUS

## 2019-01-18 MED ORDER — ASPIRIN 81 MG PO CHEW
81.0000 mg | CHEWABLE_TABLET | ORAL | Status: AC
  Administered 2019-01-18: 81 mg via ORAL
  Filled 2019-01-18: qty 1

## 2019-01-18 MED ORDER — INSULIN ASPART 100 UNIT/ML ~~LOC~~ SOLN
0.0000 [IU] | Freq: Every day | SUBCUTANEOUS | Status: DC
  Administered 2019-01-18: 2 [IU] via SUBCUTANEOUS

## 2019-01-18 MED ORDER — ASPIRIN 81 MG PO CHEW: 81.0000 mg | CHEWABLE_TABLET | ORAL | Status: DC

## 2019-01-18 MED ORDER — SODIUM CHLORIDE 0.9% FLUSH: 3.0000 mL | INTRAVENOUS | Status: DC | PRN

## 2019-01-18 MED ORDER — LEVOTHYROXINE SODIUM 100 MCG PO TABS
200.0000 ug | ORAL_TABLET | Freq: Every day | ORAL | Status: DC
  Administered 2019-01-19: 200 ug via ORAL
  Filled 2019-01-18: qty 2

## 2019-01-18 MED ORDER — VITAMIN B-12 100 MCG PO TABS
500.0000 ug | ORAL_TABLET | Freq: Every day | ORAL | Status: DC
  Administered 2019-01-19: 500 ug via ORAL
  Filled 2019-01-18: qty 5

## 2019-01-18 MED ORDER — SODIUM CHLORIDE 0.9 % IV SOLN: 250.0000 mL | INTRAVENOUS | Status: DC | PRN

## 2019-01-18 NOTE — Care Management Obs Status (Signed)
MEDICARE OBSERVATION STATUS NOTIFICATION   Patient Details  Name: Len Primas MRN: [id number] Date of Birth: [birth year]   Medicare Observation Status Notification Given:  Yes    Gala Lewandowsky, RN 01/18/2019, 4:25 PM

## 2019-01-18 NOTE — Progress Notes (Signed)
  Echocardiogram 2D Echocardiogram has been performed.  Dave Cervantes 01/18/2019, 3:25 PM

## 2019-01-18 NOTE — Progress Notes (Signed)
Patient admitted after midnight, please see H&P.  Here with b/l leg pain and worsening neuropathy as well as SOB.   Neuropathy: could be multifactorial: DM, hypothyroid, B12- -medications adjusted SOB: ? Cardiac equivalent echo reading pending-- may need cath if abnormal-- cardiology consult appreciated Marlin Canary DO

## 2019-01-18 NOTE — Progress Notes (Addendum)
10 AM dose of lopressor 50 mg PO held.  HR= 54.

## 2019-01-18 NOTE — Progress Notes (Signed)
ANTICOAGULATION CONSULT NOTE - Follow Up Consult  Pharmacy Consult for heparin Indication: NSTEMI   Labs: Recent Labs    01/17/19 1542 01/17/19 1602 01/17/19 2233 01/18/19 0357  HGB 15.3  --   --  13.2  HCT 44.8  --   --  39.4  PLT 198  --   --  180  HEPARINUNFRC  --   --   --  0.32  CREATININE 1.42* 1.30*  --  1.38*  TROPONINI  --   --  0.15*  --      Assessment/Plan:  67yo male therapeutic on heparin with initial dosing for NSTEMI. Will continue gtt at current rate and confirm stable with additional level.   Vernard Gambles, PharmD, BCPS  01/18/2019,6:27 AM

## 2019-01-18 NOTE — H&P (Signed)
History and Physical   Dave Cervantes SAY:[id number] DOB: 1951/02/17 DOA: 01/17/2019  PCP: System, Pcp Not In  Chief Complaint: shortness of breath  History is obtained via patient report, discussion with the emergency medicine team, and review of the electronic medical record.  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  The patient reports he moved some furniture over the weekend and had increased dyspnea on exertion.  He reports his symptoms were better with rest, but reports he has persistent increased dyspnea on exertion compared to 2 weeks ago.  He reports the dyspnea lasted 10 to 15 minutes, cites that his prior cardiac events he never had chest pain but had similar symptoms.  Associated symptoms include cold sweats.  He denies any nausea, fevers, chest pain, bleeding per oral cavity or rectum.  He did not use nitroglycerin, he receives most of his medical care at the [place].  Is a former smoker, denies drinking alcohol.  Served in the [employer].  Currently spends most of his time on the computer, would like to be more active.  Lives with his sister in [place].  ED Course: In the emergency department heart rate initially 107 which normalized upon reevaluation, systolic blood pressure ranging from 135-165.  CBC within normal limits.  CMP remarkable for creatinine of 1.4, glucose 231.  Cardiac biomarkers remarkable for initial troponin 0.12 -> 0.22 -> 0.15.  EKG did not show overt ST segment elevation, did reveal right bundle branch block.  Underwent CTA which did not reveal evidence of pulmonary embolism.  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  Chest x-ray without acute cardiopulmonary disease.  Cardiology team was consulted who recommended trending of cardiac biomarkers and they would consider diagnostic catheterization in the morning.  Cardiology APP ordered heparin dosing per pharmacy consult.  Emergency medicine team consulted hospital medicine for further management.  Review of Systems: A complete ROS was obtained; pertinent positives negatives are denoted in the HPI. Otherwise, all systems are negative.   Past Medical History:  Diagnosis Date  . CAD (coronary artery disease)    a. 09/2015 NSTEMI in setting of DKA/Cath: LM min irregs, LAD 80ost/p, 90-95p, 40m, 70d, D1 40, LCX80diff, RCA 100; b. 10/2015 s/p CABG [facility]).  . Diastolic dysfunction    a. 01/2015 Echo: EF 65-70%, Gr1 DD.  Marland Kitchen Graves disease   . Hyperlipidemia   . Hypertension   . Hypothyroidism   . Peripheral neuropathy   . Venous insufficiency    Social History   Socioeconomic History  . Marital status: Single    Spouse name: Not on file  . Number of children: Not on file  . Years of education: Not on file  . Highest education level: Not on file  Occupational History  . Not on file  Social Needs  . Financial resource strain: Not on file  . Food insecurity:    Worry: Not on file    Inability: Not on file  . Transportation needs:    Medical: Not on file    Non-medical: Not on file  Tobacco Use  . Smoking status: Former Smoker    Last attempt to quit: 12/06/1984    Years since quitting: 34.1  Substance and  Sexual Activity  . Alcohol use: Never    Frequency: Never  . Drug use: Never  . Sexual activity: Not on file  Lifestyle  . Physical activity:    Days per week: Not on file    Minutes per session: Not on file  . Stress: Not on file  Relationships  . Social connections:    Talks on phone: Not on file    Gets together: Not on file    Attends religious service: Not on file    Active member of club or organization: Not on file     Attends meetings of clubs or organizations: Not on file    Relationship status: Not on file  . Intimate partner violence:    Fear of current or ex partner: Not on file    Emotionally abused: Not on file    Physically abused: Not on file    Forced sexual activity: Not on file  Other Topics Concern  . Not on file  Social History Narrative  . Not on file   Family History  Problem Relation Age of Onset  . Diabetes Mother   . Coronary artery disease Mother   . Diabetes Sister   . Dementia Father     Physical Exam: Vitals:   01/17/19 2130 01/17/19 2200 01/17/19 2301 01/17/19 2302  BP: (!) 139/93 140/90 (!) 165/93   Pulse: 71 68 75   Resp: 16 18 17    Temp:   97.9 F (36.6 C)   TempSrc:   Oral   SpO2: 95% 94%  100%  Weight:      Height:       General: Appears calm and comfortable, pleasant tired appearing obese black man ENT: Grossly normal hearing, MMM.  Poor dentition with multiple decaying teeth fragments. Cardiovascular: RRR. No M/R/G. No LE edema.  No lower extremity edema; well-healed prior sternotomy scar Respiratory: CTA bilaterally. No wheezes or crackles. Normal respiratory effort.  Breathing room air Abdomen: Soft, non-tender. Skin: No rash or induration seen on limited exam, some hyperpigmentation changes to the right lower extremity Musculoskeletal: Grossly normal tone BUE/BLE. Appropriate ROM.  Sits up without assistance Psychiatric: Grossly normal mood and affect. Neurologic: Moves all extremities in coordinated fashion.  I have personally reviewed the following labs, culture data, and imaging studies.  Assessment/Plan:  #Increasing DOE #Elevated troponin #NSTEMI, possible Course: hx of CABG in 2016, presenting symptoms of increasing DOE, EKG with R BBB, troponin peaked at 0.22, trended down to 0.15.  Cardiology team evaluated the patient the ED, ordered heparin gtt, will consider catheterization in AM. CT PE study without evidence of PE. A/P: based on  history and clinical picture with exertional dyspnea worsening and biomarker positivity, suspect symptoms are cardiac in origin - may benefit from further ischemic evaluation as outlined by cardiology team in AM. Will continue hep gtt until cardiology dictates otherwise.  Troponin has peaked, will re-trend if new symptoms develop. Obtain EKG in AM.  NPO at MN for possible ischemic eval. Continue home BB, dual antiplatelet therapy, ARB, and statin. SL nitroglycerin prn for CP.  A1c and lipid profile in AM.    #Other problems: -CKD: uncertain Cr baseline, prior values of 1.8 -DM, insulin dependent: home use of NPH of 15 units AM and 16 in PM, will use once daily basal insulin at reduced dose with correction factor as needed; A1c pending  -Neuropathy, DM associated: will check B12 in event this is co-contributor -Hx of Grave's disease s/p RAI: continue home thyroid  supplement, update TSH -Peripheral vascular disease: seen on CTA of neck, consider discussion (inpatient vs outpatient) with vascular surgery regarding stenosis of right vertebral artery  DVT prophylaxis: On therapeutic hep gtt Code Status: full Disposition Plan: Anticipate D/C home when medically ready Consults called: cardiology consulted by emergency medicine team Admission status: admit to hospital service, telemetry floor   Laurell RoofPatrick Kuhlman, MD [facility] Page:[phone]  If 7PM-7AM, please contact night-coverage [email or web address] Password [email or web address]  This document was created using the aid of voice recognition / dication software.

## 2019-01-18 NOTE — Progress Notes (Addendum)
Progress Note  Patient Name: Dave Cervantes Date of Encounter: 01/18/2019  Primary Cardiologist: No primary care provider on file. [facility]  Subjective   Still with mild leg pain. Breathing is better.   Inpatient Medications    Scheduled Meds: . aspirin EC  81 mg Oral Daily  . atorvastatin  80 mg Oral q1800  . clopidogrel  75 mg Oral Daily  . gabapentin  300 mg Oral TID  . insulin aspart  0-15 Units Subcutaneous Q6H  . insulin glargine  12 Units Subcutaneous QHS  . levothyroxine  175 mcg Oral QAC breakfast  . losartan  50 mg Oral Daily  . metoprolol tartrate  50 mg Oral BID  . sodium chloride flush  3 mL Intravenous Q12H   Continuous Infusions: . sodium chloride    . sodium chloride 1 mL/kg/hr (01/18/19 0809)  . heparin 1,300 Units/hr (01/17/19 2038)   PRN Meds: sodium chloride, acetaminophen, morphine injection, nitroGLYCERIN, ondansetron (ZOFRAN) IV, sodium chloride flush   Vital Signs    Vitals:   01/17/19 2302 01/18/19 0549 01/18/19 0700 01/18/19 0828  BP:  (!) 150/89  124/82  Pulse:  66  (!) 54  Resp:  14    Temp:  97.7 F (36.5 C)    TempSrc:  Oral    SpO2: 100% 100%    Weight:   118.9 kg   Height:        Intake/Output Summary (Last 24 hours) at 01/18/2019 0932 Last data filed at 01/18/2019 0759 Gross per 24 hour  Intake 1003.9 ml  Output 900 ml  Net 103.9 ml   Last 3 Weights 01/18/2019 01/17/2019 01/29/2015  Weight (lbs) 262 lb 3.2 oz 265 lb 271 lb 6.2 oz  Weight (kg) 118.933 kg 120.203 kg 123.1 kg      Telemetry    SR - Personally Reviewed  ECG    SR with RBBB - Personally Reviewed  Physical Exam   GEN: No acute distress.   Neck: No JVD Cardiac: RRR, no murmurs, rubs, or gallops.  Respiratory: Clear to auscultation bilaterally. GI: Soft, nontender, non-distended  MS: No edema; No deformity. Neuro:  Nonfocal  Psych: Normal affect   Labs    Chemistry Recent Labs  Lab 01/17/19 1542 01/17/19 1602 01/18/19 0357  NA 140  --   141  K 3.5  --  3.4*  CL 100  --  105  CO2 25  --  25  GLUCOSE 231*  --  278*  BUN 12  --  10  CREATININE 1.42* 1.30* 1.38*  CALCIUM 9.1  --  8.7*  PROT 7.2  --   --   ALBUMIN 4.3  --   --   AST 42*  --   --   ALT 51*  --   --   ALKPHOS 76  --   --   BILITOT 0.4  --   --   GFRNONAA 51*  --  53*  GFRAA 59*  --  >60  ANIONGAP 15  --  11     Hematology Recent Labs  Lab 01/17/19 1542 01/18/19 0357  WBC 6.6 5.0  RBC 4.56 4.03*  HGB 15.3 13.2  HCT 44.8 39.4  MCV 98.2 97.8  MCH 33.6 32.8  MCHC 34.2 33.5  RDW 12.6 12.8  PLT 198 180    Cardiac Enzymes Recent Labs  Lab 01/17/19 2233 01/18/19 0717  TROPONINI 0.15* 0.09*    Recent Labs  Lab 01/17/19 1546 01/17/19 1915  TROPIPOC 0.12*  0.22*     BNPNo results for input(s): BNP, PROBNP in the last 168 hours.   DDimer No results for input(s): DDIMER in the last 168 hours.   Radiology    Ct Angio Head W/cm &/or Wo Cm  Result Date: 01/17/2019 CLINICAL DATA:  Imbalance. BILATERAL leg pain. Diabetes. Hypertension. EXAM: CT ANGIOGRAPHY HEAD AND NECK TECHNIQUE: Multidetector CT imaging of the head and neck was performed using the standard protocol during bolus administration of intravenous contrast. Multiplanar CT image reconstructions and MIPs were obtained to evaluate the vascular anatomy. Carotid stenosis measurements (when applicable) are obtained utilizing NASCET criteria, using the distal internal carotid diameter as the denominator. CONTRAST:  75 mL ISOVUE-370 IOPAMIDOL (ISOVUE-370) INJECTION 76% COMPARISON:  MR head 01/22/2015. CTA chest reported separately. FINDINGS: CT HEAD FINDINGS Brain: No evidence for acute infarction, hemorrhage, mass lesion, hydrocephalus, or extra-axial fluid. Normal for age cerebral volume. Hypoattenuation of white matter, likely small vessel disease. Vascular: Calcification of the cavernous internal carotid arteries consistent with cerebrovascular atherosclerotic disease. No signs of intracranial  large vessel occlusion. Skull: Calvarium intact. No worrisome osseous lesion. Sinuses: Unremarkable. Orbits: No acute findings. CTA NECK FINDINGS Aortic arch: Bovine trunk. 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 No common carotid or RIGHT subclavian stenosis is evident. Right carotid system: There is calcified and noncalcified plaque at the carotid bifurcation extending into the RIGHT internal carotid artery over length of approximately 3 cm. At the most narrow segment, there is a luminal web associated with a heavily calcified plaque resulting in 50% stenosis based on measurements of 2.2/4.4 proximal/distal. No fibromuscular change. No concern for distal ICA dissection. Left carotid system: No evidence of dissection, stenosis (50% or greater) or occlusion. Calcified and noncalcified plaque at the bifurcation. No focal areas of concerning stenosis Vertebral arteries: BILATERAL vertebral artery patency is established and the vessels are relatively symmetric. LEFT vertebral origin is widely patent. RIGHT vertebral origin is severely diseased, estimated 75-90% stenosis. Skeleton: Spondylosis. Poor dentition. Numerous missing and diseased teeth. Other neck: No soft tissue abnormalities of significance. Upper chest: Reported separately. Review of the MIP images confirms the above findings CTA HEAD FINDINGS Anterior circulation: Calcification of the cavernous internal carotid arteries consistent with cerebrovascular atherosclerotic disease. No flow-limiting stenosis or branch occlusion. No saccular aneurysm. Posterior circulation: Heavily calcified plaque with significant luminal narrowing of the RIGHT vertebral artery V4 segment, adjacent to PICA, estimated 75-90% stenosis. Otherwise, no significant stenosis, proximal occlusion, aneurysm, or vascular malformation. Venous sinuses: As permitted by contrast timing, patent. Anatomic variants: None of significance. Delayed phase: No abnormal postcontrast enhancement. Review of  the MIP images confirms the above findings IMPRESSION: 1. Calcified and noncalcified plaque at the RIGHT internal carotid artery resulting in 50% stenosis. 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 2. Calcified and noncalcified plaque at the LEFT carotid bifurcation without significant stenosis. 3. Severely diseased RIGHT vertebral origin, estimated 75-90% stenosis with an additional tandem lesion of equal severity in the RIGHT vertebral V4 segment. Significance uncertain given the patent LEFT vertebral. 4. No anterior circulation intracranial stenosis or branch occlusion. No acute intracranial findings. Atrophy and small vessel disease. 5. Poor dentition. Electronically Signed   By: Elsie Stain M.D.   On: 01/17/2019 18:02   Dg Chest 2 View  Result Date: 01/17/2019 CLINICAL DATA:  BILATERAL leg pain. EXAM: CHEST - 2 VIEW COMPARISON:  01/21/2015. FINDINGS: Normal heart size. Clear lung fields. No bony abnormality. Prior CABG. No effusion pneumothorax. IMPRESSION: No active  cardiopulmonary disease. Electronically Signed   By: Elsie Stain M.D.   On: 01/17/2019 12:53   Ct Angio Neck W And/or Wo Contrast  Result Date: 01/17/2019 CLINICAL DATA:  Imbalance. BILATERAL leg pain. Diabetes. Hypertension. EXAM: CT ANGIOGRAPHY HEAD AND NECK TECHNIQUE: Multidetector CT imaging of the head and neck was performed using the standard protocol during bolus administration of intravenous contrast. Multiplanar CT image reconstructions and MIPs were obtained to evaluate the vascular anatomy. Carotid stenosis measurements (when applicable) are obtained utilizing NASCET criteria, using the distal internal carotid diameter as the denominator. CONTRAST:  75 mL ISOVUE-370 IOPAMIDOL (ISOVUE-370) INJECTION 76% COMPARISON:  MR head 01/22/2015. CTA chest reported separately. FINDINGS: CT HEAD FINDINGS Brain: No evidence for acute infarction, hemorrhage, mass lesion, hydrocephalus,  or extra-axial fluid. Normal for age cerebral volume. Hypoattenuation of white matter, likely small vessel disease. Vascular: Calcification of the cavernous internal carotid arteries consistent with cerebrovascular atherosclerotic disease. No signs of intracranial large vessel occlusion. Skull: Calvarium intact. No worrisome osseous lesion. Sinuses: Unremarkable. Orbits: No acute findings. CTA NECK FINDINGS Aortic arch: Bovine trunk. 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 No common carotid or RIGHT subclavian stenosis is evident. Right carotid system: There is calcified and noncalcified plaque at the carotid bifurcation extending into the RIGHT internal carotid artery over length of approximately 3 cm. At the most narrow segment, there is a luminal web associated with a heavily calcified plaque resulting in 50% stenosis based on measurements of 2.2/4.4 proximal/distal. No fibromuscular change. No concern for distal ICA dissection. Left carotid system: No evidence of dissection, stenosis (50% or greater) or occlusion. Calcified and noncalcified plaque at the bifurcation. No focal areas of concerning stenosis Vertebral arteries: BILATERAL vertebral artery patency is established and the vessels are relatively symmetric. LEFT vertebral origin is widely patent. RIGHT vertebral origin is severely diseased, estimated 75-90% stenosis. Skeleton: Spondylosis. Poor dentition. Numerous missing and diseased teeth. Other neck: No soft tissue abnormalities of significance. Upper chest: Reported separately. Review of the MIP images confirms the above findings CTA HEAD FINDINGS Anterior circulation: Calcification of the cavernous internal carotid arteries consistent with cerebrovascular atherosclerotic disease. No flow-limiting stenosis or branch occlusion. No saccular aneurysm. Posterior circulation: Heavily calcified plaque with significant luminal narrowing of the RIGHT vertebral artery V4 segment, adjacent to PICA, estimated  75-90% stenosis. Otherwise, no significant stenosis, proximal occlusion, aneurysm, or vascular malformation. Venous sinuses: As permitted by contrast timing, patent. Anatomic variants: None of significance. Delayed phase: No abnormal postcontrast enhancement. Review of the MIP images confirms the above findings IMPRESSION: 1. Calcified and noncalcified plaque at the RIGHT internal carotid artery resulting in 50% stenosis. 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 2. Calcified and noncalcified plaque at the LEFT carotid bifurcation without significant stenosis. 3. Severely diseased RIGHT vertebral origin, estimated 75-90% stenosis with an additional tandem lesion of equal severity in the RIGHT vertebral V4 segment. Significance uncertain given the patent LEFT vertebral. 4. No anterior circulation intracranial stenosis or branch occlusion. No acute intracranial findings. Atrophy and small vessel disease. 5. Poor dentition. Electronically Signed   By: Elsie Stain M.D.   On: 01/17/2019 18:02   Ct Angio Chest Pe W/cm &/or Wo Cm  Result Date: 01/17/2019 CLINICAL DATA:  BILATERAL leg pain, shortness of breath which is increased in the last few days, cough, history hypertension EXAM: CT ANGIOGRAPHY CHEST WITH CONTRAST TECHNIQUE: Multidetector CT imaging of the chest was performed using the standard protocol during bolus administration of intravenous contrast.  Multiplanar CT image reconstructions and MIPs were obtained to evaluate the vascular anatomy. CONTRAST:  150mL ISOVUE-370 IOPAMIDOL (ISOVUE-370) INJECTION 76% IV COMPARISON:  None FINDINGS: Cardiovascular: Postsurgical changes of CABG. Upper normal size of cardiac chambers. Aorta normal caliber without aneurysm or dissection. No pericardial effusion. Pulmonary arteries adequately opacified and patent. No evidence of pulmonary embolism. Scattered coronary arterial calcifications. Mediastinum/Nodes: Esophagus  normal appearance. Base of cervical region normal appearance. No thoracic adenopathy. Lungs/Pleura: Minimal dependent atelectasis and central peribronchial thickening. No pulmonary infiltrate, pleural effusion or pneumothorax. Upper Abdomen: Visualized upper abdomen unremarkable Musculoskeletal: Prior median sternotomy. No acute osseous findings. Review of the MIP images confirms the above findings. IMPRESSION: No evidence of pulmonary embolism. Minimal atelectasis and peribronchial thickening. No additional acute intrathoracic abnormalities. Electronically Signed   By: Ulyses SouthwardMark  Boles M.D.   On: 01/17/2019 17:56   Vas [place] Lower Extremity Venous (dvt) (only Mc & Wl 7a-7p)  Result Date: 01/17/2019  Lower Venous Study Indications: Swelling.  Performing Technologist: Jeb LeveringJill Parker RDMS, RVT  Examination Guidelines: A complete evaluation includes B-mode imaging, spectral Doppler, color Doppler, and power Doppler as needed of all accessible portions of each vessel. Bilateral testing is considered an integral part of a complete examination. Limited examinations for reoccurring indications may be performed as noted.  Right Venous Findings: +---------+---------------+---------+-----------+----------+-------+          CompressibilityPhasicitySpontaneityPropertiesSummary +---------+---------------+---------+-----------+----------+-------+ CFV      Full           Yes      Yes                          +---------+---------------+---------+-----------+----------+-------+ SFJ      Full                                                 +---------+---------------+---------+-----------+----------+-------+ FV Prox  Full                                                 +---------+---------------+---------+-----------+----------+-------+ FV Mid   Full                                                 +---------+---------------+---------+-----------+----------+-------+ FV DistalFull                                                  +---------+---------------+---------+-----------+----------+-------+ PFV      Full                                                 +---------+---------------+---------+-----------+----------+-------+ POP      Full           Yes      Yes                          +---------+---------------+---------+-----------+----------+-------+  PTV      Full                                                 +---------+---------------+---------+-----------+----------+-------+ PERO     Full                                                 +---------+---------------+---------+-----------+----------+-------+  Left Venous Findings: +---+---------------+---------+-----------+----------+-------+    CompressibilityPhasicitySpontaneityPropertiesSummary +---+---------------+---------+-----------+----------+-------+ CFVFull           Yes      Yes                          +---+---------------+---------+-----------+----------+-------+    Summary: Right: There is no evidence of deep vein thrombosis in the lower extremity. No cystic structure found in the popliteal fossa. Left: No evidence of common femoral vein obstruction.  *See table(s) above for measurements and observations.    Preliminary     Cardiac Studies   TTE: pending  Patient Profile     68 y.o. male with a history of coronary artery disease status post CABG in 2016, hypertension, hyperlipidemia, diabetes mellitus, peripheral neuropathy, venous insufficiency, and diastolic dysfunction who presented with dyspnea and leg pain. Found to have elevated troponin.   Assessment & Plan    1.  Elevated Troponin: Patient presented with a one-week history of progressive dyspnea on exertion.  Reported this dyspnea was significantly less than what he experienced with his previous NSTEMI. No chest pain. Echo is pending. May be able to defer cath until echo completed. Will discuss with MD.   2.  Essential hypertension:  Blood pressure elevated on arrival.  Home medications resumed. Improved today.  3.  Hyperlipidemia: Continue high potency statin therapy.  4.  Type 2 diabetes mellitus: Defer management to internal medicine.  5.  Mild renal insufficiency: Creatinine 1.42 on arrival, improved to 1.38 today.   6.  Peripheral neuropathy: Defer management to internal medicine.  This appears have been progressive and he identifies this as one of the main reasons for coming to the ER today.  For questions or updates, please contact [facility] Please consult [email or web address] for contact info under        Signed, Laverda PageLindsay Roberts, NP  01/18/2019, 9:32 AM    I have examined the patient and reviewed assessment and plan and discussed with patient.  Agree with above as stated.  Check echo.  Do not feel that he needs early cath. Will encourage ambulation.    Appears that he is hypothyroid.  Will increase Synthroid.  If echo is ok, would recommend outpatient f/u.  [facility], could plan for stress test.   Lance MussJayadeep Klyn Kroening

## 2019-01-18 NOTE — Care Management Note (Addendum)
Case Management Note  Patient Details  Name: Dave Cervantes MRN: [id number] Date of Birth: [birth year]  Subjective/Objective: Pt presented for Chest Pain and pain in legs- hx neuropathy. PTA from home. PCP @ [facility] [place]. CM did call the [facility] to make them aware that patient has been hospitalized. Spoke to [employer] on on call.                 Action/Plan: No home needs identified at time of visit. [facility]   Expected Discharge Date:                  Expected Discharge Plan:  Home/Self Care  In-House Referral:  NA  Discharge planning Services  CM Consult  Post Acute Care Choice:  NA Choice offered to:  NA  DME Arranged:  N/A DME Agency:  NA  HH Arranged:  NA HH Agency:  NA  Status of Service:  Completed, signed off  If discussed at Long Length of Stay Meetings, dates discussed:    Additional Comments:  Gala Lewandowsky, RN 01/18/2019, 4:26 PM

## 2019-01-19 DIAGNOSIS — G629 Polyneuropathy, unspecified: Secondary | ICD-10-CM | POA: Diagnosis not present

## 2019-01-19 DIAGNOSIS — R0602 Shortness of breath: Secondary | ICD-10-CM | POA: Diagnosis not present

## 2019-01-19 DIAGNOSIS — E039 Hypothyroidism, unspecified: Secondary | ICD-10-CM | POA: Diagnosis not present

## 2019-01-19 DIAGNOSIS — M79605 Pain in left leg: Secondary | ICD-10-CM

## 2019-01-19 DIAGNOSIS — M79604 Pain in right leg: Secondary | ICD-10-CM | POA: Diagnosis not present

## 2019-01-19 DIAGNOSIS — I25118 Atherosclerotic heart disease of native coronary artery with other forms of angina pectoris: Secondary | ICD-10-CM | POA: Diagnosis not present

## 2019-01-19 LAB — BASIC METABOLIC PANEL
ANION GAP: 8 (ref 5–15)
BUN: 9 mg/dL (ref 8–23)
CO2: 27 mmol/L (ref 22–32)
Calcium: 8.7 mg/dL — ABNORMAL LOW (ref 8.9–10.3)
Chloride: 105 mmol/L (ref 98–111)
Creatinine, Ser: 1.51 mg/dL — ABNORMAL HIGH (ref 0.61–1.24)
GFR calc Af Amer: 55 mL/min — ABNORMAL LOW (ref >60)
GFR calc non Af Amer: 47 mL/min — ABNORMAL LOW (ref >60)
Glucose, Bld: 167 mg/dL — ABNORMAL HIGH (ref 70–99)
Potassium: 3.4 mmol/L — ABNORMAL LOW (ref 3.5–5.1)
Sodium: 140 mmol/L (ref 135–145)

## 2019-01-19 LAB — CBC
HCT: 40.2 % (ref 39.0–52.0)
Hemoglobin: 13.1 g/dL (ref 13.0–17.0)
MCH: 32.2 pg (ref 26.0–34.0)
MCHC: 32.6 g/dL (ref 30.0–36.0)
MCV: 98.8 fL (ref 80.0–100.0)
Platelets: 173 10*3/uL (ref 150–400)
RBC: 4.07 MIL/uL — AB (ref 4.22–5.81)
RDW: 12.8 % (ref 11.5–15.5)
WBC: 3.9 10*3/uL — AB (ref 4.0–10.5)
nRBC: 0 % (ref 0.0–0.2)

## 2019-01-19 LAB — GLUCOSE, CAPILLARY
GLUCOSE-CAPILLARY: 188 mg/dL — AB (ref 70–99)
Glucose-Capillary: 212 mg/dL — ABNORMAL HIGH (ref 70–99)
Glucose-Capillary: 211 mg/dL — ABNORMAL HIGH (ref 70–99)

## 2019-01-19 LAB — HEMOGLOBIN A1C
Hgb A1c MFr Bld: 10.7 % — ABNORMAL HIGH (ref 4.8–5.6)
Mean Plasma Glucose: 260 mg/dL

## 2019-01-19 MED ORDER — INSULIN NPH (HUMAN) (ISOPHANE) 100 UNIT/ML ~~LOC~~ SUSP: 15.0000 [IU] | SUBCUTANEOUS | 11 refills | Status: AC

## 2019-01-19 MED ORDER — POTASSIUM CHLORIDE CRYS ER 20 MEQ PO TBCR
40.0000 meq | EXTENDED_RELEASE_TABLET | Freq: Once | ORAL | Status: AC
  Administered 2019-01-19: 40 meq via ORAL
  Filled 2019-01-19: qty 2

## 2019-01-19 MED ORDER — FUROSEMIDE 20 MG PO TABS: 40.0000 mg | ORAL_TABLET | Freq: Every day | ORAL | Status: AC

## 2019-01-19 MED ORDER — B COMPLEX PO TABS: 1.0000 | ORAL_TABLET | Freq: Every day | ORAL | 0 refills | Status: AC

## 2019-01-19 MED ORDER — CLOPIDOGREL BISULFATE 75 MG PO TABS: 75.0000 mg | ORAL_TABLET | Freq: Every day | ORAL | 0 refills | Status: AC

## 2019-01-19 MED ORDER — LOSARTAN POTASSIUM 50 MG PO TABS: 50.0000 mg | ORAL_TABLET | Freq: Every day | ORAL | Status: DC

## 2019-01-19 MED ORDER — LEVOTHYROXINE SODIUM 200 MCG PO TABS: 200.0000 ug | ORAL_TABLET | Freq: Every day | ORAL | 0 refills | Status: AC

## 2019-01-19 NOTE — Progress Notes (Signed)
Inpatient Diabetes Program Recommendations  AACE/ADA: New Consensus Statement on Inpatient Glycemic Control (2015)  Target Ranges:  Prepandial:   less than 140 mg/dL      Peak postprandial:   less than 180 mg/dL (1-2 hours)      Critically ill patients:  140 - 180 mg/dL   Lab Results  Component Value Date   GLUCAP 188 (H) 01/19/2019   HGBA1C 10.7 (H) 01/18/2019    SPoke with patient about current A1c level, A1c and glucose goals, and importance of glucose control. Patient goes to the [place] in [place]. Patient has not seen a doctor in approx 1 year. Spoke to patient about medication adjustments and calling his doctor for glucose over 200 all the time and lees than 70. Patient reports checking his glucose 2 times a day.  Thanks,  Christena Deem RN, MSN, BC-ADM Inpatient Diabetes Coordinator Team Pager [phone] (8a-5p)

## 2019-01-19 NOTE — Progress Notes (Addendum)
Progress Note  Patient Name: Dave LevinsWilliam Cervantes Date of Encounter: 01/19/2019  [facility]  Subjective   Hasn't ambulated yet. No chest pain or dyspnea at rest.   Inpatient Medications    Scheduled Meds: . aspirin EC  81 mg Oral Daily  . atorvastatin  80 mg Oral q1800  . clopidogrel  75 mg Oral Daily  . gabapentin  800 mg Oral TID  . insulin aspart  0-5 Units Subcutaneous QHS  . insulin aspart  0-9 Units Subcutaneous TID WC  . insulin glargine  12 Units Subcutaneous QHS  . levothyroxine  200 mcg Oral Q0600  . [START ON 01/21/2019] losartan  50 mg Oral Daily  . metoprolol tartrate  50 mg Oral BID  . potassium chloride  40 mEq Oral Once  . sodium chloride flush  3 mL Intravenous Q12H  . vitamin B-12  500 mcg Oral Daily   Continuous Infusions: . sodium chloride     PRN Meds: sodium chloride, acetaminophen, nitroGLYCERIN, ondansetron (ZOFRAN) IV, sodium chloride flush   Vital Signs    Vitals:   01/18/19 2149 01/19/19 0556 01/19/19 0602 01/19/19 0828  BP:  (!) 129/92  (!) 147/75  Pulse: (!) 57 (!) 48  65  Resp:  15    Temp:  (!) 97.5 F (36.4 C)    TempSrc:  Oral    SpO2:  98%    Weight:   118.4 kg   Height:        Intake/Output Summary (Last 24 hours) at 01/19/2019 0921 Last data filed at 01/19/2019 0634 Gross per 24 hour  Intake 603 ml  Output 1450 ml  Net -847 ml   Last 3 Weights 01/19/2019 01/18/2019 01/17/2019  Weight (lbs) 261 lb 1.6 oz 262 lb 3.2 oz 265 lb  Weight (kg) 118.434 kg 118.933 kg 120.203 kg      Telemetry    Sr at 50-60s Personally Reviewed  ECG    N/A  Physical Exam   GEN: No acute distress.   Neck: No JVD Cardiac: RRR, no murmurs, rubs, or gallops.  Respiratory: Clear to auscultation bilaterally. GI: Soft, nontender, non-distended  MS: No edema; No deformity. Neuro:  Nonfocal  Psych: Normal affect   Labs    Chemistry Recent Labs  Lab 01/17/19 1542 01/17/19 1602  01/18/19 0357 01/19/19 0338  NA 140  --  141 140  K 3.5  --  3.4* 3.4*  CL 100  --  105 105  CO2 25  --  25 27  GLUCOSE 231*  --  278* 167*  BUN 12  --  10 9  CREATININE 1.42* 1.30* 1.38* 1.51*  CALCIUM 9.1  --  8.7* 8.7*  PROT 7.2  --   --   --   ALBUMIN 4.3  --   --   --   AST 42*  --   --   --   ALT 51*  --   --   --   ALKPHOS 76  --   --   --   BILITOT 0.4  --   --   --   GFRNONAA 51*  --  53* 47*  GFRAA 59*  --  >60 55*  ANIONGAP 15  --  11 8     Hematology Recent Labs  Lab 01/17/19 1542 01/18/19 0357 01/19/19 0338  WBC 6.6 5.0 3.9*  RBC 4.56 4.03* 4.07*  HGB 15.3 13.2 13.1  HCT 44.8 39.4 40.2  MCV 98.2 97.8 98.8  MCH 33.6 32.8 32.2  MCHC 34.2 33.5 32.6  RDW 12.6 12.8 12.8  PLT 198 180 173    Cardiac Enzymes Recent Labs  Lab 01/17/19 2233 01/18/19 0717  TROPONINI 0.15* 0.09*    Recent Labs  Lab 01/17/19 1546 01/17/19 1915  TROPIPOC 0.12* 0.22*    Radiology    Ct Angio Head W/cm &/or Wo Cm  Result Date: 01/17/2019 CLINICAL DATA:  Imbalance. BILATERAL leg pain. Diabetes. Hypertension. EXAM: CT ANGIOGRAPHY HEAD AND NECK TECHNIQUE: Multidetector CT imaging of the head and neck was performed using the standard protocol during bolus administration of intravenous contrast. Multiplanar CT image reconstructions and MIPs were obtained to evaluate the vascular anatomy. Carotid stenosis measurements (when applicable) are obtained utilizing NASCET criteria, using the distal internal carotid diameter as the denominator. CONTRAST:  75 mL ISOVUE-370 IOPAMIDOL (ISOVUE-370) INJECTION 76% COMPARISON:  MR head 01/22/2015. CTA chest reported separately. FINDINGS: CT HEAD FINDINGS Brain: No evidence for acute infarction, hemorrhage, mass lesion, hydrocephalus, or extra-axial fluid. Normal for age cerebral volume. Hypoattenuation of white matter, likely small vessel disease. Vascular: Calcification of the cavernous internal carotid arteries consistent with cerebrovascular  atherosclerotic disease. No signs of intracranial large vessel occlusion. Skull: Calvarium intact. No worrisome osseous lesion. Sinuses: Unremarkable. Orbits: No acute findings. CTA NECK FINDINGS Aortic arch: Bovine trunk. 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 No common carotid or RIGHT subclavian stenosis is evident. Right carotid system: There is calcified and noncalcified plaque at the carotid bifurcation extending into the RIGHT internal carotid artery over length of approximately 3 cm. At the most narrow segment, there is a luminal web associated with a heavily calcified plaque resulting in 50% stenosis based on measurements of 2.2/4.4 proximal/distal. No fibromuscular change. No concern for distal ICA dissection. Left carotid system: No evidence of dissection, stenosis (50% or greater) or occlusion. Calcified and noncalcified plaque at the bifurcation. No focal areas of concerning stenosis Vertebral arteries: BILATERAL vertebral artery patency is established and the vessels are relatively symmetric. LEFT vertebral origin is widely patent. RIGHT vertebral origin is severely diseased, estimated 75-90% stenosis. Skeleton: Spondylosis. Poor dentition. Numerous missing and diseased teeth. Other neck: No soft tissue abnormalities of significance. Upper chest: Reported separately. Review of the MIP images confirms the above findings CTA HEAD FINDINGS Anterior circulation: Calcification of the cavernous internal carotid arteries consistent with cerebrovascular atherosclerotic disease. No flow-limiting stenosis or branch occlusion. No saccular aneurysm. Posterior circulation: Heavily calcified plaque with significant luminal narrowing of the RIGHT vertebral artery V4 segment, adjacent to PICA, estimated 75-90% stenosis. Otherwise, no significant stenosis, proximal occlusion, aneurysm, or vascular malformation. Venous sinuses: As permitted by contrast timing, patent. Anatomic variants: None of significance. Delayed  phase: No abnormal postcontrast enhancement. Review of the MIP images confirms the above findings IMPRESSION: 1. Calcified and noncalcified plaque at the RIGHT internal carotid artery resulting in 50% stenosis. 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 2. Calcified and noncalcified plaque at the LEFT carotid bifurcation without significant stenosis. 3. Severely diseased RIGHT vertebral origin, estimated 75-90% stenosis with an additional tandem lesion of equal severity in the RIGHT vertebral V4 segment. Significance uncertain given the patent LEFT vertebral. 4. No anterior circulation intracranial stenosis or branch occlusion. No acute intracranial findings. Atrophy and small vessel disease. 5. Poor dentition. Electronically Signed   By: Elsie Stain M.D.   On: 01/17/2019 18:02   Dg Chest 2 View  Result Date: 01/17/2019 CLINICAL DATA:  BILATERAL  leg pain. EXAM: CHEST - 2 VIEW COMPARISON:  01/21/2015. FINDINGS: Normal heart size. Clear lung fields. No bony abnormality. Prior CABG. No effusion pneumothorax. IMPRESSION: No active cardiopulmonary disease. Electronically Signed   By: Elsie Stain M.D.   On: 01/17/2019 12:53   Ct Angio Neck W And/or Wo Contrast  Result Date: 01/17/2019 CLINICAL DATA:  Imbalance. BILATERAL leg pain. Diabetes. Hypertension. EXAM: CT ANGIOGRAPHY HEAD AND NECK TECHNIQUE: Multidetector CT imaging of the head and neck was performed using the standard protocol during bolus administration of intravenous contrast. Multiplanar CT image reconstructions and MIPs were obtained to evaluate the vascular anatomy. Carotid stenosis measurements (when applicable) are obtained utilizing NASCET criteria, using the distal internal carotid diameter as the denominator. CONTRAST:  75 mL ISOVUE-370 IOPAMIDOL (ISOVUE-370) INJECTION 76% COMPARISON:  MR head 01/22/2015. CTA chest reported separately. FINDINGS: CT HEAD FINDINGS Brain: No evidence for  acute infarction, hemorrhage, mass lesion, hydrocephalus, or extra-axial fluid. Normal for age cerebral volume. Hypoattenuation of white matter, likely small vessel disease. Vascular: Calcification of the cavernous internal carotid arteries consistent with cerebrovascular atherosclerotic disease. No signs of intracranial large vessel occlusion. Skull: Calvarium intact. No worrisome osseous lesion. Sinuses: Unremarkable. Orbits: No acute findings. CTA NECK FINDINGS Aortic arch: Bovine trunk. 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 No common carotid or RIGHT subclavian stenosis is evident. Right carotid system: There is calcified and noncalcified plaque at the carotid bifurcation extending into the RIGHT internal carotid artery over length of approximately 3 cm. At the most narrow segment, there is a luminal web associated with a heavily calcified plaque resulting in 50% stenosis based on measurements of 2.2/4.4 proximal/distal. No fibromuscular change. No concern for distal ICA dissection. Left carotid system: No evidence of dissection, stenosis (50% or greater) or occlusion. Calcified and noncalcified plaque at the bifurcation. No focal areas of concerning stenosis Vertebral arteries: BILATERAL vertebral artery patency is established and the vessels are relatively symmetric. LEFT vertebral origin is widely patent. RIGHT vertebral origin is severely diseased, estimated 75-90% stenosis. Skeleton: Spondylosis. Poor dentition. Numerous missing and diseased teeth. Other neck: No soft tissue abnormalities of significance. Upper chest: Reported separately. Review of the MIP images confirms the above findings CTA HEAD FINDINGS Anterior circulation: Calcification of the cavernous internal carotid arteries consistent with cerebrovascular atherosclerotic disease. No flow-limiting stenosis or branch occlusion. No saccular aneurysm. Posterior circulation: Heavily calcified plaque with significant luminal narrowing of the RIGHT  vertebral artery V4 segment, adjacent to PICA, estimated 75-90% stenosis. Otherwise, no significant stenosis, proximal occlusion, aneurysm, or vascular malformation. Venous sinuses: As permitted by contrast timing, patent. Anatomic variants: None of significance. Delayed phase: No abnormal postcontrast enhancement. Review of the MIP images confirms the above findings IMPRESSION: 1. Calcified and noncalcified plaque at the RIGHT internal carotid artery resulting in 50% stenosis. 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 2. Calcified and noncalcified plaque at the LEFT carotid bifurcation without significant stenosis. 3. Severely diseased RIGHT vertebral origin, estimated 75-90% stenosis with an additional tandem lesion of equal severity in the RIGHT vertebral V4 segment. Significance uncertain given the patent LEFT vertebral. 4. No anterior circulation intracranial stenosis or branch occlusion. No acute intracranial findings. Atrophy and small vessel disease. 5. Poor dentition. Electronically Signed   By: Elsie Stain M.D.   On: 01/17/2019 18:02   Ct Angio Chest Pe W/cm &/or Wo Cm  Result Date: 01/17/2019 CLINICAL DATA:  BILATERAL leg pain, shortness of breath which is increased in the last few days,  cough, history hypertension EXAM: CT ANGIOGRAPHY CHEST WITH CONTRAST TECHNIQUE: Multidetector CT imaging of the chest was performed using the standard protocol during bolus administration of intravenous contrast. Multiplanar CT image reconstructions and MIPs were obtained to evaluate the vascular anatomy. CONTRAST:  ISOVUE-370 IOPAMIDOL (ISOVUE-370) INJECTION 76% IV COMPARISON:  None FINDINGS: Cardiovascular: Postsurgical changes of CABG. Upper normal size of cardiac chambers. Aorta normal caliber without aneurysm or dissection. No pericardial effusion. Pulmonary arteries adequately opacified and patent. No evidence of pulmonary embolism. Scattered  coronary arterial calcifications. Mediastinum/Nodes: Esophagus normal appearance. Base of cervical region normal appearance. No thoracic adenopathy. Lungs/Pleura: Minimal dependent atelectasis and central peribronchial thickening. No pulmonary infiltrate, pleural effusion or pneumothorax. Upper Abdomen: Visualized upper abdomen unremarkable Musculoskeletal: Prior median sternotomy. No acute osseous findings. Review of the MIP images confirms the above findings. IMPRESSION: No evidence of pulmonary embolism. Minimal atelectasis and peribronchial thickening. No additional acute intrathoracic abnormalities. Electronically Signed   By: Ulyses Southward M.D.   On: 01/17/2019 17:56   Vas [place] Lower Extremity Venous (dvt) (only Mc & Wl 7a-7p)  Result Date: 01/18/2019  Lower Venous Study Indications: Swelling.  Performing Technologist: Jeb Levering RDMS, RVT  Examination Guidelines: A complete evaluation includes B-mode imaging, spectral Doppler, color Doppler, and power Doppler as needed of all accessible portions of each vessel. Bilateral testing is considered an integral part of a complete examination. Limited examinations for reoccurring indications may be performed as noted.  Right Venous Findings: +---------+---------------+---------+-----------+----------+-------+          CompressibilityPhasicitySpontaneityPropertiesSummary +---------+---------------+---------+-----------+----------+-------+ CFV      Full           Yes      Yes                          +---------+---------------+---------+-----------+----------+-------+ SFJ      Full                                                 +---------+---------------+---------+-----------+----------+-------+ FV Prox  Full                                                 +---------+---------------+---------+-----------+----------+-------+ FV Mid   Full                                                  +---------+---------------+---------+-----------+----------+-------+ FV DistalFull                                                 +---------+---------------+---------+-----------+----------+-------+ PFV      Full                                                 +---------+---------------+---------+-----------+----------+-------+ POP      Full  Yes      Yes                          +---------+---------------+---------+-----------+----------+-------+ PTV      Full                                                 +---------+---------------+---------+-----------+----------+-------+ PERO     Full                                                 +---------+---------------+---------+-----------+----------+-------+  Left Venous Findings: +---+---------------+---------+-----------+----------+-------+    CompressibilityPhasicitySpontaneityPropertiesSummary +---+---------------+---------+-----------+----------+-------+ CFVFull           Yes      Yes                          +---+---------------+---------+-----------+----------+-------+    Summary: Right: There is no evidence of deep vein thrombosis in the lower extremity. No cystic structure found in the popliteal fossa. Left: No evidence of common femoral vein obstruction.  *See table(s) above for measurements and observations. Electronically signed by Sherald Hess MD on 01/18/2019 at 2:47:23 PM.    Final     Cardiac Studies   Echo 01/18/2019 IMPRESSIONS    1. The left ventricle has normal systolic function with an ejection fraction of 60-65%. The cavity size was normal. There is mildly increased left ventricular wall thickness. Left ventricular diastolic Doppler parameters are consistent with impaired  relaxation.  2. The right ventricle has normal systolic function. The cavity was normal. There is no increase in right ventricular wall thickness.  3. Right atrial pressure is estimated at 3 mmHg.  4.  The mitral valve is normal in structure.  5. The tricuspid valve is normal in structure.  6. The aortic valve is tricuspid There is mild calcification of the aortic valve.  7. The pulmonic valve was normal in structure.  8. Normal LV systolic function; mild diastolic dysfunction; mild LVH.  Patient Profile     68 y.o. male with a history of coronary artery disease status post CABG in 2016, hypertension, hyperlipidemia, diabetes mellitus, peripheral neuropathy, venous insufficiency, and diastolic dysfunction who presented with dyspnea and leg pain. Found to have elevated troponin.   Assessment & Plan    1. Elevated troponin - POC troponin 0.12>>>0.22. Troponin 0.15>>0.09.  - Patient presented with a one-week history of progressive dyspnea on exertion. Reported this dyspnea was significantly less than what he experienced with his previous NSTEMI. No chest pain. - Echo showed LVEF of 60-65%; mild diastolic dysfunction; mild LVH. - Ischemic evaluation likely outpatient.  - He was only taking ASA at home, Plavix added here (MD to advise for long term) - Continue statin and BB.   2. AKI - Scr 1.42>>1.3>>1.38>>1.51 - Unknown baseline - ? Consider holding losartan for few days or Scr of 1.3-1.5 could be baseline  3. Carotid artery disease - CT angio of neck  1. Calcified and noncalcified plaque at the RIGHT internal carotid artery resulting in 50% stenosis. 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 2. Calcified and noncalcified plaque at the LEFT carotid bifurcation without significant  stenosis. 3. Severely diseased RIGHT vertebral origin, estimated 75-90% stenosis with an additional tandem lesion of equal severity in the RIGHT vertebral V4 segment. Significance uncertain given the patent LEFT vertebral.  - Will need outpatient vascular evaluation - Continue ASA and statin. Plavix as above  4. 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  Agree with above as stated.    No further chest discomfort.  His biggest complaint is his leg pain.  This limits his walking.  He would like to get back to exercising but the leg pain is limiting.  His breathing is fine.  His echocardiogram showed normal LV function without regional wall motion abnormality.  I do not think ischemic testing is needed as an inpatient.  He was hypothyroid.  We increased his Synthroid.  If his symptoms persist after he is euthyroid, could consider outpatient stress testing.  I would like to avoid cardiac cath if possible as he has some renal insufficiency.  No compelling reason for cardiac cath at this time or any other exposure to IV contrast dye.  We are happy to follow him as an outpatient.  He does typically see the [place].  If it works out better for him to be seen at the [place], that is fine as well.  He had not been following up with a cardiologist on a regular basis despite his bypass surgery several years ago.  His bypass was done at the [facility], but he typically follows with his doctors in [place] and [place].  He will need a cardiologist going forward.  [facility] will sign off.   Medication Recommendations:  Increased synthroid Other recommendations (labs, testing, etc):  Outpatient f/u of TSH Follow up as an outpatient:  Per [facility] [place] [facility]  [address]

## 2019-01-19 NOTE — Discharge Instructions (Signed)

## 2019-01-19 NOTE — Discharge Summary (Signed)
Physician Discharge Summary  Dave Cervantes MMH:[id number] DOB: [birth year] DOA: 01/17/2019  [facility]  Admit date: 01/17/2019 Discharge date: 01/19/2019  Admitted From: home Discharge disposition: home   Recommendations for Outpatient Follow-Up:   Referral to vascular Follow TSH, B12 and HgbA1c BMP 1 week  Discharge Diagnosis:   Active Problems:   SOB (shortness of breath)    Discharge Condition: Improved.  Diet recommendation: Low sodium, heart healthy.  Carbohydrate-modified  Wound care: None.  Code status: Full.   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  The patient reports he moved some furniture over the weekend and had increased dyspnea on exertion.  He reports his symptoms were better with rest, but reports he has persistent increased dyspnea on exertion compared to 2 weeks ago.  He reports the dyspnea lasted 10 to 15 minutes, cites that his prior cardiac events he never had chest pain but had similar symptoms.  Associated symptoms include cold sweats.  He denies any nausea, fevers, chest pain, bleeding per oral cavity or rectum.  He did not use nitroglycerin, he receives most of his medical care at the [place].  Is a former smoker, denies drinking alcohol.  Served in the [employer].  Currently spends most of his time on the computer, would like to be more active.  Lives with his sister in [place].   Hospital Course by Problem:   Increasing DOE/Elevated troponin -per cardiology:  If his symptoms persist after he is euthyroid, could consider outpatient stress testing.  I would like to avoid cardiac  cath if possible as he has some renal insufficiency.  No compelling reason for cardiac cath at this time or any other exposure to IV contrast dye.  CKD stage III - uncertain Cr baseline, prior values of 1.8  DM, insulin dependent:  -uncontrolled -NPH adjusted, needs better control  Neuropathy, DM associated vs B12 vs hypothyroid -optimize medications/labs  Hx of Grave's disease s/p RAI: -increased synthroid  Peripheral vascular disease:  -CTA: Calcified and noncalcified plaque at the RIGHT internal carotid artery resulting in 50% stenosis. 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 2. Calcified and noncalcified plaque at the LEFT carotid bifurcation without significant stenosis. 3. Severely diseased RIGHT vertebral origin, estimated 75-90% stenosis with an additional tandem lesion of equal severity in the RIGHT vertebral V4 segment. Significance uncertain given the patent LEFT vertebral. -needs vascular follow up   Medical Consultants:      Discharge Exam:   Vitals:   01/19/19 0556 01/19/19 0828  BP: (!) 129/92 (!) 147/75  Pulse: (!) 48 65  Resp: 15   Temp: (!) 97.5 F (36.4 C)   SpO2: 98%    Vitals:   01/18/19 2149 01/19/19 0556 01/19/19 0602 01/19/19 0828  BP:  (!) 129/92  (!) 147/75  Pulse: (!) 57 (!) 48  65  Resp:  15    Temp:  (!) 97.5 F (36.4 C)    TempSrc:  Oral    SpO2:  98%    Weight:   118.4 kg   Height:        General exam: Appears calm and comfortable.    The results of significant  diagnostics from this hospitalization (including imaging, microbiology, ancillary and laboratory) are listed below for reference.     Procedures and Diagnostic Studies:   Ct Angio Head W/cm &/or Wo Cm  Result Date: 01/17/2019 CLINICAL DATA:  Imbalance. BILATERAL leg pain. Diabetes. Hypertension. EXAM: CT ANGIOGRAPHY HEAD AND NECK TECHNIQUE: Multidetector CT imaging of the head and neck was performed using  the standard protocol during bolus administration of intravenous contrast. Multiplanar CT image reconstructions and MIPs were obtained to evaluate the vascular anatomy. Carotid stenosis measurements (when applicable) are obtained utilizing NASCET criteria, using the distal internal carotid diameter as the denominator. CONTRAST:  75 mL ISOVUE-370 IOPAMIDOL (ISOVUE-370) INJECTION 76% COMPARISON:  MR head 01/22/2015. CTA chest reported separately. FINDINGS: CT HEAD FINDINGS Brain: No evidence for acute infarction, hemorrhage, mass lesion, hydrocephalus, or extra-axial fluid. Normal for age cerebral volume. Hypoattenuation of white matter, likely small vessel disease. Vascular: Calcification of the cavernous internal carotid arteries consistent with cerebrovascular atherosclerotic disease. No signs of intracranial large vessel occlusion. Skull: Calvarium intact. No worrisome osseous lesion. Sinuses: Unremarkable. Orbits: No acute findings. CTA NECK FINDINGS Aortic arch: Bovine trunk. 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 No common carotid or RIGHT subclavian stenosis is evident. Right carotid system: There is calcified and noncalcified plaque at the carotid bifurcation extending into the RIGHT internal carotid artery over length of approximately 3 cm. At the most narrow segment, there is a luminal web associated with a heavily calcified plaque resulting in 50% stenosis based on measurements of 2.2/4.4 proximal/distal. No fibromuscular change. No concern for distal ICA dissection. Left carotid system: No evidence of dissection, stenosis (50% or greater) or occlusion. Calcified and noncalcified plaque at the bifurcation. No focal areas of concerning stenosis Vertebral arteries: BILATERAL vertebral artery patency is established and the vessels are relatively symmetric. LEFT vertebral origin is widely patent. RIGHT vertebral origin is severely diseased, estimated 75-90% stenosis. Skeleton: Spondylosis. Poor dentition.  Numerous missing and diseased teeth. Other neck: No soft tissue abnormalities of significance. Upper chest: Reported separately. Review of the MIP images confirms the above findings CTA HEAD FINDINGS Anterior circulation: Calcification of the cavernous internal carotid arteries consistent with cerebrovascular atherosclerotic disease. No flow-limiting stenosis or branch occlusion. No saccular aneurysm. Posterior circulation: Heavily calcified plaque with significant luminal narrowing of the RIGHT vertebral artery V4 segment, adjacent to PICA, estimated 75-90% stenosis. Otherwise, no significant stenosis, proximal occlusion, aneurysm, or vascular malformation. Venous sinuses: As permitted by contrast timing, patent. Anatomic variants: None of significance. Delayed phase: No abnormal postcontrast enhancement. Review of the MIP images confirms the above findings IMPRESSION: 1. Calcified and noncalcified plaque at the RIGHT internal carotid artery resulting in 50% stenosis. 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 2. Calcified and noncalcified plaque at the LEFT carotid bifurcation without significant stenosis. 3. Severely diseased RIGHT vertebral origin, estimated 75-90% stenosis with an additional tandem lesion of equal severity in the RIGHT vertebral V4 segment. Significance uncertain given the patent LEFT vertebral. 4. No anterior circulation intracranial stenosis or branch occlusion. No acute intracranial findings. Atrophy and small vessel disease. 5. Poor dentition. Electronically Signed   By: Staci Righter M.D.   On: 01/17/2019 18:02   Dg Chest 2 View  Result Date: 01/17/2019 CLINICAL DATA:  BILATERAL leg pain. EXAM: CHEST - 2 VIEW COMPARISON:  01/21/2015. FINDINGS: Normal heart size. Clear lung fields. No bony abnormality. Prior CABG. No effusion pneumothorax. IMPRESSION: No active cardiopulmonary disease. Electronically Signed   By: Staci Righter  M.D.    On: 01/17/2019 12:53   Ct Angio Neck W And/or Wo Contrast  Result Date: 01/17/2019 CLINICAL DATA:  Imbalance. BILATERAL leg pain. Diabetes. Hypertension. EXAM: CT ANGIOGRAPHY HEAD AND NECK TECHNIQUE: Multidetector CT imaging of the head and neck was performed using the standard protocol during bolus administration of intravenous contrast. Multiplanar CT image reconstructions and MIPs were obtained to evaluate the vascular anatomy. Carotid stenosis measurements (when applicable) are obtained utilizing NASCET criteria, using the distal internal carotid diameter as the denominator. CONTRAST:  75 mL ISOVUE-370 IOPAMIDOL (ISOVUE-370) INJECTION 76% COMPARISON:  MR head 01/22/2015. CTA chest reported separately. FINDINGS: CT HEAD FINDINGS Brain: No evidence for acute infarction, hemorrhage, mass lesion, hydrocephalus, or extra-axial fluid. Normal for age cerebral volume. Hypoattenuation of white matter, likely small vessel disease. Vascular: Calcification of the cavernous internal carotid arteries consistent with cerebrovascular atherosclerotic disease. No signs of intracranial large vessel occlusion. Skull: Calvarium intact. No worrisome osseous lesion. Sinuses: Unremarkable. Orbits: No acute findings. CTA NECK FINDINGS Aortic arch: Bovine trunk. 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 No common carotid or RIGHT subclavian stenosis is evident. Right carotid system: There is calcified and noncalcified plaque at the carotid bifurcation extending into the RIGHT internal carotid artery over length of approximately 3 cm. At the most narrow segment, there is a luminal web associated with a heavily calcified plaque resulting in 50% stenosis based on measurements of 2.2/4.4 proximal/distal. No fibromuscular change. No concern for distal ICA dissection. Left carotid system: No evidence of dissection, stenosis (50% or greater) or occlusion. Calcified and noncalcified plaque at the bifurcation. No focal areas of concerning  stenosis Vertebral arteries: BILATERAL vertebral artery patency is established and the vessels are relatively symmetric. LEFT vertebral origin is widely patent. RIGHT vertebral origin is severely diseased, estimated 75-90% stenosis. Skeleton: Spondylosis. Poor dentition. Numerous missing and diseased teeth. Other neck: No soft tissue abnormalities of significance. Upper chest: Reported separately. Review of the MIP images confirms the above findings CTA HEAD FINDINGS Anterior circulation: Calcification of the cavernous internal carotid arteries consistent with cerebrovascular atherosclerotic disease. No flow-limiting stenosis or branch occlusion. No saccular aneurysm. Posterior circulation: Heavily calcified plaque with significant luminal narrowing of the RIGHT vertebral artery V4 segment, adjacent to PICA, estimated 75-90% stenosis. Otherwise, no significant stenosis, proximal occlusion, aneurysm, or vascular malformation. Venous sinuses: As permitted by contrast timing, patent. Anatomic variants: None of significance. Delayed phase: No abnormal postcontrast enhancement. Review of the MIP images confirms the above findings IMPRESSION: 1. Calcified and noncalcified plaque at the RIGHT internal carotid artery resulting in 50% stenosis. 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 2. Calcified and noncalcified plaque at the LEFT carotid bifurcation without significant stenosis. 3. Severely diseased RIGHT vertebral origin, estimated 75-90% stenosis with an additional tandem lesion of equal severity in the RIGHT vertebral V4 segment. Significance uncertain given the patent LEFT vertebral. 4. No anterior circulation intracranial stenosis or branch occlusion. No acute intracranial findings. Atrophy and small vessel disease. 5. Poor dentition. Electronically Signed   By: Staci Righter M.D.   On: 01/17/2019 18:02   Ct Angio Chest Pe W/cm &/or Wo Cm  Result Date:  01/17/2019 CLINICAL DATA:  BILATERAL leg pain, shortness of breath which is increased in the last few days, cough, history hypertension EXAM: CT ANGIOGRAPHY CHEST WITH CONTRAST TECHNIQUE: Multidetector CT imaging of the chest was performed using the standard protocol during bolus administration of intravenous contrast. Multiplanar CT image reconstructions and MIPs were obtained to  evaluate the vascular anatomy. CONTRAST:  181m ISOVUE-370 IOPAMIDOL (ISOVUE-370) INJECTION 76% IV COMPARISON:  None FINDINGS: Cardiovascular: Postsurgical changes of CABG. Upper normal size of cardiac chambers. Aorta normal caliber without aneurysm or dissection. No pericardial effusion. Pulmonary arteries adequately opacified and patent. No evidence of pulmonary embolism. Scattered coronary arterial calcifications. Mediastinum/Nodes: Esophagus normal appearance. Base of cervical region normal appearance. No thoracic adenopathy. Lungs/Pleura: Minimal dependent atelectasis and central peribronchial thickening. No pulmonary infiltrate, pleural effusion or pneumothorax. Upper Abdomen: Visualized upper abdomen unremarkable Musculoskeletal: Prior median sternotomy. No acute osseous findings. Review of the MIP images confirms the above findings. IMPRESSION: No evidence of pulmonary embolism. Minimal atelectasis and peribronchial thickening. No additional acute intrathoracic abnormalities. Electronically Signed   By: MLavonia DanaM.D.   On: 01/17/2019 17:56   Vas UKoreaLower Extremity Venous (dvt) (only Mc & Wl 7a-7p)  Result Date: 01/18/2019  Lower Venous Study Indications: Swelling.  Performing Technologist: JJune LeapRDMS, RVT  Examination Guidelines: A complete evaluation includes B-mode imaging, spectral Doppler, color Doppler, and power Doppler as needed of all accessible portions of each vessel. Bilateral testing is considered an integral part of a complete examination. Limited examinations for reoccurring indications may be performed  as noted.  Right Venous Findings: +---------+---------------+---------+-----------+----------+-------+          CompressibilityPhasicitySpontaneityPropertiesSummary +---------+---------------+---------+-----------+----------+-------+ CFV      Full           Yes      Yes                          +---------+---------------+---------+-----------+----------+-------+ SFJ      Full                                                 +---------+---------------+---------+-----------+----------+-------+ FV Prox  Full                                                 +---------+---------------+---------+-----------+----------+-------+ FV Mid   Full                                                 +---------+---------------+---------+-----------+----------+-------+ FV DistalFull                                                 +---------+---------------+---------+-----------+----------+-------+ PFV      Full                                                 +---------+---------------+---------+-----------+----------+-------+ POP      Full           Yes      Yes                          +---------+---------------+---------+-----------+----------+-------+ PTV  Full                                                 +---------+---------------+---------+-----------+----------+-------+ PERO     Full                                                 +---------+---------------+---------+-----------+----------+-------+  Left Venous Findings: +---+---------------+---------+-----------+----------+-------+    CompressibilityPhasicitySpontaneityPropertiesSummary +---+---------------+---------+-----------+----------+-------+ CFVFull           Yes      Yes                          +---+---------------+---------+-----------+----------+-------+    Summary: Right: There is no evidence of deep vein thrombosis in the lower extremity. No cystic structure found in the  popliteal fossa. Left: No evidence of common femoral vein obstruction.  *See table(s) above for measurements and observations. Electronically signed by Monica Martinez MD on 01/18/2019 at 2:47:23 PM.    Final      Labs:   Basic Metabolic Panel: Recent Labs  Lab 01/17/19 1542 01/17/19 1602 01/18/19 0357 01/19/19 0338  NA 140  --  141 140  K 3.5  --  3.4* 3.4*  CL 100  --  105 105  CO2 25  --  25 27  GLUCOSE 231*  --  278* 167*  BUN 12  --  10 9  CREATININE 1.42* 1.30* 1.38* 1.51*  CALCIUM 9.1  --  8.7* 8.7*  MG 2.0  --   --   --    GFR Estimated Creatinine Clearance: 62.1 mL/min (A) (by C-G formula based on SCr of 1.51 mg/dL (H)). Liver Function Tests: Recent Labs  Lab 01/17/19 1542  AST 42*  ALT 51*  ALKPHOS 76  BILITOT 0.4  PROT 7.2  ALBUMIN 4.3   No results for input(s): LIPASE, AMYLASE in the last 168 hours. No results for input(s): AMMONIA in the last 168 hours. Coagulation profile No results for input(s): INR, PROTIME in the last 168 hours.  CBC: Recent Labs  Lab 01/17/19 1542 01/18/19 0357 01/19/19 0338  WBC 6.6 5.0 3.9*  NEUTROABS 2.9  --   --   HGB 15.3 13.2 13.1  HCT 44.8 39.4 40.2  MCV 98.2 97.8 98.8  PLT 198 180 173   Cardiac Enzymes: Recent Labs  Lab 01/17/19 2233 01/18/19 0717  TROPONINI 0.15* 0.09*   BNP: Invalid input(s): POCBNP CBG: Recent Labs  Lab 01/18/19 1402 01/18/19 1742 01/18/19 2120 01/19/19 0803 01/19/19 1124  GLUCAP 156* 230* 212* 211* 188*   D-Dimer No results for input(s): DDIMER in the last 72 hours. Hgb A1c Recent Labs    01/18/19 0357  HGBA1C 10.7*   Lipid Profile Recent Labs    01/18/19 0357  CHOL 144  HDL 32*  LDLCALC 49  TRIG 317*  CHOLHDL 4.5   Thyroid function studies Recent Labs    01/18/19 0357  TSH 6.423*   Anemia work up Recent Labs    01/18/19 [place] 296   Microbiology Recent Results (from the past 240 hour(s))  Urine culture     Status: None   Collection Time:  01/17/19  3:42 PM  Result Value Ref Range Status  Specimen Description URINE, CLEAN CATCH  Final   Special Requests NONE  Final   Culture   Final    NO GROWTH Performed at [facility], [place] [address] [place], [place] [address]    Report Status 01/18/2019 FINAL  Final     Discharge Instructions:   Discharge Instructions    Diet - low sodium heart healthy   Complete by:  As directed    Discharge instructions   Complete by:  As directed    Will need a referral to vascular surgery for follow up of vertebral artery TSH in 4 weeks Keep log of blood sugars and bring to next appointment with PCP for adjustment of your insulin for better control BMP in 1 week B12 level in 4 weeks (resume your B complex vitamin)   Increase activity slowly   Complete by:  As directed      Allergies as of 01/19/2019   No Known Allergies     Medication List    STOP taking these medications   insulin NPH-regular Human (70-30) 100 UNIT/ML injection Commonly known as:  NOVOLIN 70/30   naproxen sodium 220 MG tablet Commonly known as:  ALEVE     TAKE these medications   aspirin 81 MG tablet Take 81 mg by mouth daily.   atorvastatin 80 MG tablet Commonly known as:  LIPITOR Take 1 tablet (80 mg total) by mouth daily at 6 PM.   b complex vitamins tablet Take 1 tablet by mouth daily.   blood glucose meter kit and supplies Kit Dispense based on patient and insurance preference. Use up to four times daily as directed. (FOR ICD-9 250.00, 250.01).   clopidogrel 75 MG tablet Commonly known as:  PLAVIX Take 1 tablet (75 mg total) by mouth daily.   furosemide 20 MG tablet Commonly known as:  LASIX Take 2 tablets (40 mg total) by mouth daily. Start taking on:  January 21, 2019 What changed:    how much to take  when to take this  reasons to take this  additional instructions  These instructions start on January 21, 2019. If you are unsure what to do until then, ask your doctor or  other care provider.   gabapentin 400 MG capsule Commonly known as:  NEURONTIN Take 800 mg by mouth 3 (three) times daily.   insulin NPH Human 100 UNIT/ML injection Commonly known as:  HUMULIN N,NOVOLIN N Inject 0.15-0.16 mLs (15-16 Units total) into the skin See admin instructions. 18 units in the morning and 19 units in the evening What changed:  additional instructions   levothyroxine 200 MCG tablet Commonly known as:  SYNTHROID, LEVOTHROID Take 1 tablet (200 mcg total) by mouth daily at 6 (six) AM. Start taking on:  January 20, 2019 What changed:    medication strength  how much to take  when to take this   losartan 100 MG tablet Commonly known as:  COZAAR Take 50 mg by mouth daily.   metoprolol tartrate 50 MG tablet Commonly known as:  LOPRESSOR Take 50 mg by mouth 2 (two) times daily. What changed:  Another medication with the same name was removed. Continue taking this medication, and follow the directions you see here.   SYRINGE 3CC/26GX5/8" 26G X 5/8" 3 ML Misc 1 Syringe by Does not apply route 2 (two) times daily before a meal.         Time coordinating discharge: 25 min  Signed:  Geradine Girt DO  [facility]  01/19/2019, 11:42 AM

## 2019-09-14 ENCOUNTER — Other Ambulatory Visit: Payer: Self-pay

## 2019-09-14 DIAGNOSIS — Z20822 Contact with and (suspected) exposure to covid-19: Secondary | ICD-10-CM

## 2019-09-16 LAB — NOVEL CORONAVIRUS, NAA: SARS-CoV-2, NAA: NOT DETECTED

## 2019-10-18 IMAGING — CT CT ANGIO NECK
1 of 8 series · 6 of 33 positions shown · IV contrast (Omni 300)
Comparison: MR head 01/22/2015.

CTA chest reported separately.

CLINICAL DATA: Imbalance. BILATERAL leg pain. Diabetes.
Hypertension.

EXAM:
CT ANGIOGRAPHY HEAD AND NECK
TECHNIQUE: Multidetector CT imaging of the head and neck was performed using
the standard protocol during bolus administration of intravenous
contrast. Multiplanar CT image reconstructions and MIPs were
obtained to evaluate the vascular anatomy. Carotid stenosis
measurements (when applicable) are obtained utilizing NASCET
criteria, using the distal internal carotid diameter as the
denominator.
CONTRAST:  75 mL PVHOYP-5DE IOPAMIDOL (PVHOYP-5DE) INJECTION 76%

[Series 7: cta neck axial · axial · 0.44mm/px · z∈[-273,-21]mm · 6 of 354 slices shown]
[im 51/354  soft-tissue]
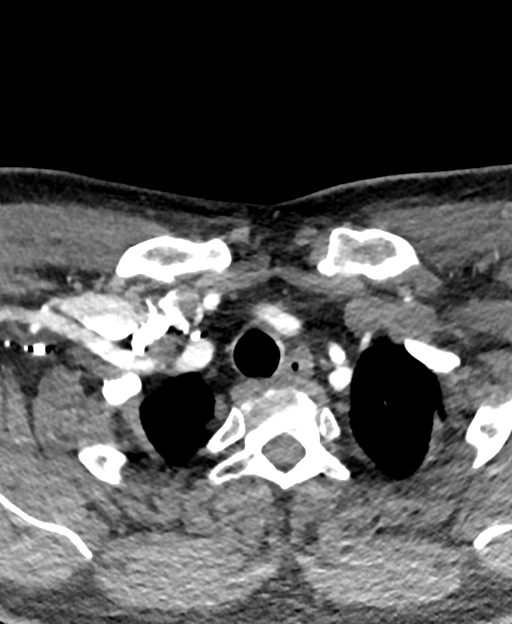
[im 101/354  bone]
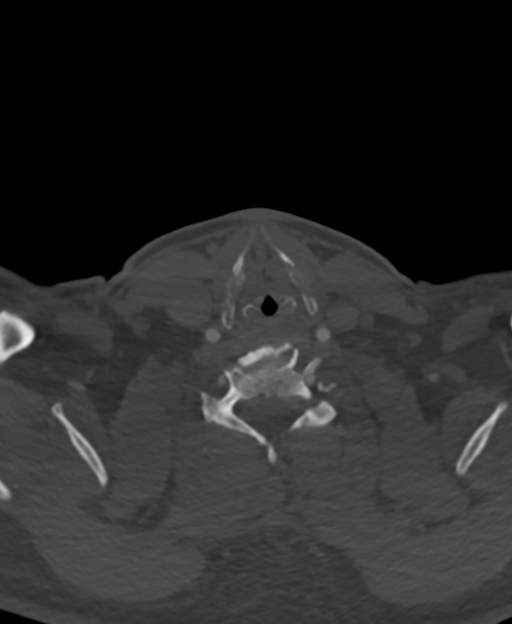
[im 152/354  soft-tissue]
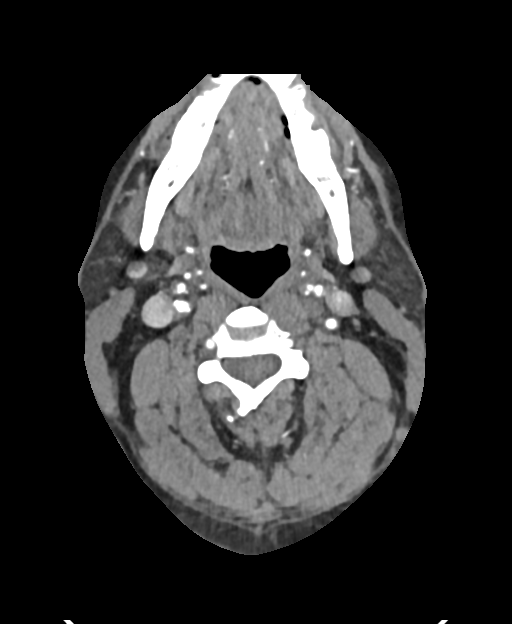
[im 202/354  bone]
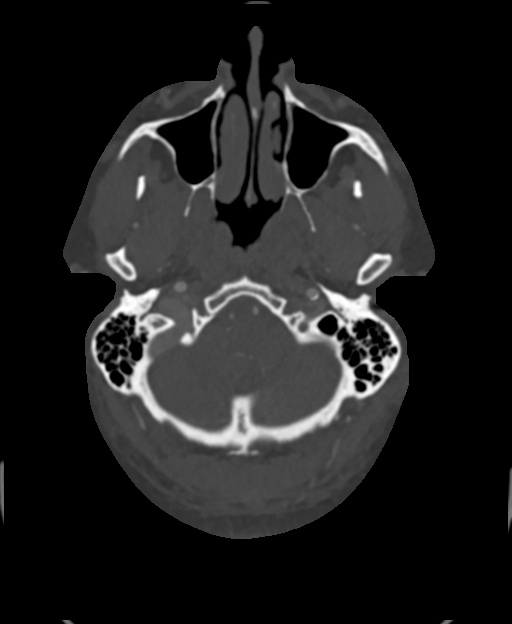
[im 253/354  soft-tissue]
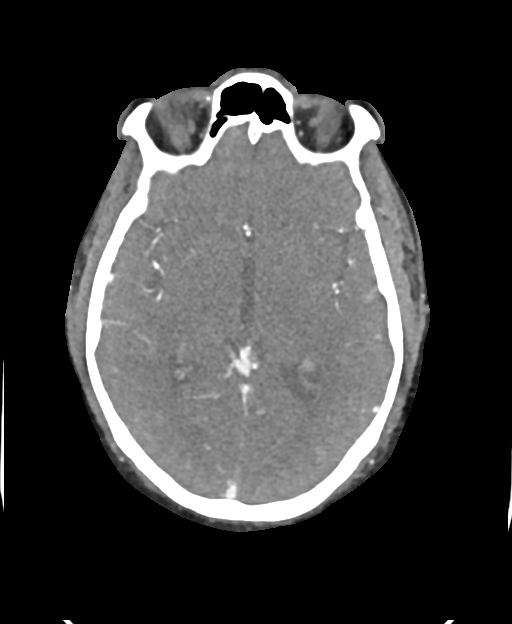
[im 303/354  bone]
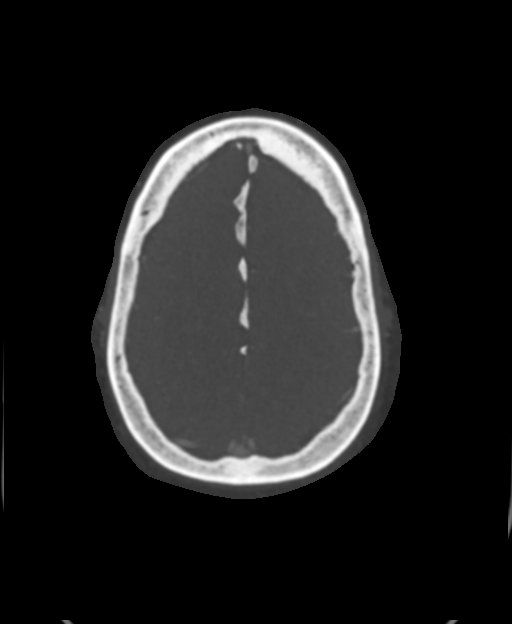

[6 of 33 positions shown; findings below may reference images not displayed]

FINDINGS: CT HEAD FINDINGS

Brain: No evidence for acute infarction, hemorrhage, mass lesion,
hydrocephalus, or extra-axial fluid. Normal for age cerebral volume.
Hypoattenuation of white matter, likely small vessel disease.

Vascular: Calcification of the cavernous internal carotid arteries
consistent with cerebrovascular atherosclerotic disease. No signs of
intracranial large vessel occlusion.

Skull: Calvarium intact. No worrisome osseous lesion.

Sinuses: Unremarkable.

Orbits: No acute findings.

CTA NECK FINDINGS

Aortic arch: Bovine trunk. 50% stenosis LEFT subclavian origin. No
common carotid or RIGHT subclavian stenosis is evident.

Right carotid system: There is calcified and noncalcified plaque at
the carotid bifurcation extending into the RIGHT internal carotid
artery over length of approximately 3 cm. At the most narrow
segment, there is a luminal web associated with a heavily calcified
plaque resulting in 50% stenosis based on measurements of 2.2/4.4
proximal/distal. No fibromuscular change. No concern for distal ICA
dissection.

Left carotid system: No evidence of dissection, stenosis (50% or
greater) or occlusion. Calcified and noncalcified plaque at the
bifurcation. No focal areas of concerning stenosis

Vertebral arteries: BILATERAL vertebral artery patency is
established and the vessels are relatively symmetric. LEFT vertebral
origin is widely patent. RIGHT vertebral origin is severely
diseased, estimated 75-90% stenosis.

Skeleton: Spondylosis. Poor dentition. Numerous missing and diseased
teeth.

Other neck: No soft tissue abnormalities of significance.

Upper chest: Reported separately.

Review of the MIP images confirms the above findings

CTA HEAD FINDINGS

Anterior circulation: Calcification of the cavernous internal
carotid arteries consistent with cerebrovascular atherosclerotic
disease. No flow-limiting stenosis or branch occlusion. No saccular
aneurysm.

Posterior circulation: Heavily calcified plaque with significant
luminal narrowing of the RIGHT vertebral artery V4 segment, adjacent
to PICA, estimated 75-90% stenosis. Otherwise, no significant
stenosis, proximal occlusion, aneurysm, or vascular malformation.

Venous sinuses: As permitted by contrast timing, patent.

Anatomic variants: None of significance.

Delayed phase: No abnormal postcontrast enhancement.

Review of the MIP images confirms the above findings
IMPRESSION: 1. Calcified and noncalcified plaque at the RIGHT internal carotid
artery resulting in 50% stenosis. Luminal web versus localized
dissection with heavily calcified plaque at the most narrow segment.
2. Calcified and noncalcified plaque at the LEFT carotid bifurcation
without significant stenosis.
3. Severely diseased RIGHT vertebral origin, estimated 75-90%
stenosis with an additional tandem lesion of equal severity in the
RIGHT vertebral V4 segment. Significance uncertain given the patent
LEFT vertebral.
4. No anterior circulation intracranial stenosis or branch
occlusion. No acute intracranial findings. Atrophy and small vessel
disease.
5. Poor dentition.
